# Patient Record
Sex: Male | Born: 1965 | Race: White | Hispanic: No
Health system: Midwestern US, Community
[De-identification: ages and names within clinical notes are randomized; demographics above are authoritative.]

## PROBLEM LIST (undated history)

## (undated) DIAGNOSIS — I2699 Other pulmonary embolism without acute cor pulmonale: Secondary | ICD-10-CM

## (undated) DIAGNOSIS — I219 Acute myocardial infarction, unspecified: Secondary | ICD-10-CM

---

## 2015-03-20 DIAGNOSIS — I219 Acute myocardial infarction, unspecified: Secondary | ICD-10-CM

## 2015-03-20 DIAGNOSIS — I2699 Other pulmonary embolism without acute cor pulmonale: Secondary | ICD-10-CM

## 2015-03-20 HISTORY — DX: Other pulmonary embolism without acute cor pulmonale: I26.99

## 2015-03-20 HISTORY — DX: Acute myocardial infarction, unspecified: I21.9

## 2017-12-28 NOTE — Nursing Note (Signed)
Medication Administration Follow Up-Text       Medication Administration Follow Up Entered On:  12/28/2017 20:35 EDT    Performed On:  12/28/2017 20:35 EDT by Ernestina Patches, RN, Adam      Intervention Information:     morphine  Performed by Ernestina Patches, RN, Adam on 12/28/2017 20:18:00 EDT       morphine,4mg   IV Push,Wrist, Left       Med Response   ED Medication Response :   Symptoms improved   Julien Girt - 12/28/2017 20:35 EDT

## 2017-12-28 NOTE — Nursing Note (Signed)
Medication Administration Follow Up-Text       Medication Administration Follow Up Entered On:  12/28/2017 20:35 EDT    Performed On:  12/28/2017 20:35 EDT by Ernestina PatchesPrescott, RN, Adam      Intervention Information:     nitroglycerin  Performed by Ernestina PatchesPrescott, RN, Adam on 12/28/2017 20:07:00 EDT       nitroglycerin,0.4mg   SL,chest pain       Med Response   ED Medication Response :   No change in symptoms   Julien Girtrescott, RN, Adam - 12/28/2017 20:35 EDT

## 2017-12-28 NOTE — Nursing Note (Signed)
Adult Patient History Form-Text       Adult Patient History Entered On:  12/28/2017 23:24 EDT    Performed On:  12/28/2017 23:20 EDT by DiBiaso, RN, JosephT               General Info   Patient Identified :   Identification band, Verbal   Patient Identified :   Isaac Andrade   Information Given By :   Self   Accompanied By :   None   Pregnancy Status :   N/A   Has the patient received chemotherapy or biotherapy within the last 48 hours? :   No   In Clinical Trial With Signed Consent for Related Condition :   No signed consent for clinical trial   Is the patient currently (2-3 days) receiving radiation treatment? :   No   DiBiaso, RN, JosephT - 12/28/2017 23:20 EDT   Allergies   (As Of: 12/28/2017 23:24:48 EDT)   Allergies (Active)   No Known Medication Allergies  Estimated Onset Date:   Unspecified ; Created By:   Ernestina Patches, RN, Adam; Reaction Status:   Active ; Category:   Drug ; Substance:   No Known Medication Allergies ; Type:   Allergy ; Updated By:   Julien Girt; Reviewed Date:   12/28/2017 23:21 EDT        Medication History   Medication List   (As Of: 12/28/2017 23:24:48 EDT)   Normal Order    aspirin 81 mg Chew Tab  :   aspirin 81 mg Chew Tab ; Status:   Ordered ; Ordered As Mnemonic:   aspirin ; Simple Display Line:   81 mg, 1 tabs, Oral, Daily ; Ordering Provider:   RIEDER-MD,  JEFFREY; Catalog Code:   aspirin ; Order Dt/Tm:   12/28/2017 23:13:04 EDT          sertraline  :   sertraline ; Status:   Voided ; Ordered As Mnemonic:   Zoloft 25 mg oral tablet ; Simple Display Line:   1 tabs, Oral, Daily ; Ordering Provider:   RIEDER-MD,  JEFFREY; Catalog Code:   sertraline ; Order Dt/Tm:   12/28/2017 23:13:10 EDT          sertraline 50 mg Tab  :   sertraline 50 mg Tab ; Status:   Ordered ; Ordered As Mnemonic:   Zoloft ; Simple Display Line:   25 mg, 0.5 tabs, Oral, Daily ; Ordering Provider:   RIEDER-MD,  JEFFREY; Catalog Code:   sertraline ; Order Dt/Tm:   12/28/2017 23:15:20 EDT          traZODone 50 mg Tab  :    traZODone 50 mg Tab ; Status:   Ordered ; Ordered As Mnemonic:   traZODone ; Simple Display Line:   50 mg, 1 tabs, Oral, TID ; Ordering Provider:   RIEDER-MD,  JEFFREY; Catalog Code:   traZODone ; Order Dt/Tm:   12/28/2017 96:04:54 EDT          traZODone  :   traZODone ; Status:   Voided ; Ordered As Mnemonic:   traZODone 50 mg oral tablet ; Simple Display Line:   1 tabs, Oral, TID ; Ordering Provider:   RIEDER-MD,  JEFFREY; Catalog Code:   traZODone ; Order Dt/Tm:   12/28/2017 23:13:12 EDT          morphine 4 mg/mL preservative-free Sol 1 mL  :   morphine 4 mg/mL preservative-free Sol 1 mL ; Status:  Completed ; Ordered As Mnemonic:   morphine ; Simple Display Line:   4 mg, 1 mL, IV Push, Once ; Ordering Provider:   Lynelle Doctor L; Catalog Code:   morphine ; Order Dt/Tm:   12/28/2017 20:10:10 EDT          Sodium Chloride 0.9% intravenous solution Bolus  :   Sodium Chloride 0.9% intravenous solution Bolus ; Status:   Completed ; Ordered As Mnemonic:   Sodium Chloride 0.9% bolus ; Simple Display Line:   1,000 mL, 2000 mL/hr, IV Piggyback, Once ; Ordering Provider:   BAUERBAND-MD,  BRIAN L; Catalog Code:   Sodium Chloride 0.9% ; Order Dt/Tm:   12/28/2017 20:10:10 EDT          iopamidol 76% Inj Soln 100 mL  :   iopamidol 76% Inj Soln 100 mL ; Status:   Completed ; Ordered As Mnemonic:   Isovue-370 ; Simple Display Line:   100 mL, IV Contrast, On Call ; Ordering Provider:   Stasia Cavalier; Catalog Code:   iopamidol ; Order Dt/Tm:   12/28/2017 20:09:49 EDT          aspirin 81 mg Chew Tab  :   aspirin 81 mg Chew Tab ; Status:   Completed ; Ordered As Mnemonic:   aspirin ; Simple Display Line:   324 mg, 4 tabs, Chewed, Once ; Ordering Provider:   Lynelle Doctor L; Catalog Code:   aspirin ; Order Dt/Tm:   12/28/2017 19:57:56 EDT          nitroglycerin 0.4 mg SL Tab  :   nitroglycerin 0.4 mg SL Tab ; Status:   Ordered ; Ordered As Mnemonic:   Nitrostat ; Simple Display Line:   0.4 mg, 1 tabs, SL,  q12min, PRN: chest pain ; Ordering Provider:   Stasia Cavalier; Catalog Code:   nitroglycerin ; Order Dt/Tm:   12/28/2017 19:57:56 EDT ; Comment:   May give every 5 minutes x3 doses as needed for chest pain or angina symptoms.            Home Meds    acetaminophen-oxyCODONE  :   acetaminophen-oxyCODONE ; Status:   Documented ; Ordered As Mnemonic:   Percocet 10/325 ; Simple Display Line:   1 tabs, Oral, q6hr, PRN: mild pain (1-3), 0 Refill(s) ; Catalog Code:   acetaminophen-oxyCODONE ; Order Dt/Tm:   12/28/2017 22:26:19 EDT ; Comment:   MAX DAILY DOSE OF ACETAMINOPHEN = 4000 MG          aspirin  :   aspirin ; Status:   Documented ; Ordered As Mnemonic:   aspirin 81 mg oral tablet ; Simple Display Line:   81 mg, 1 tabs, Oral, Daily, 90 tabs, 0 Refill(s) ; Catalog Code:   aspirin ; Order Dt/Tm:   12/28/2017 22:26:52 EDT          OLANZapine  :   OLANZapine ; Status:   Documented ; Ordered As Mnemonic:   ZyPREXA ; Simple Display Line:   0 Refill(s) ; Catalog Code:   OLANZapine ; Order Dt/Tm:   12/28/2017 16:10:96 EDT          sertraline  :   sertraline ; Status:   Documented ; Ordered As Mnemonic:   Zoloft 25 mg oral tablet ; Simple Display Line:   mg, tabs, Oral, Daily, 0 Refill(s) ; Catalog Code:   sertraline ; Order Dt/Tm:   12/28/2017 22:26:36 EDT  traZODone  :   traZODone ; Status:   Documented ; Ordered As Mnemonic:   traZODone 50 mg oral tablet ; Simple Display Line:   mg, tabs, Oral, TID, 0 Refill(s) ; Catalog Code:   traZODone ; Order Dt/Tm:   12/28/2017 22:26:42 EDT            Problem History   (As Of: 12/28/2017 23:24:48 EDT)   Diagnoses(Active)    Acute chest pain  Date:   12/28/2017 ; Diagnosis Type:   Discharge ; Confirmation:   Confirmed ; Clinical Dx:   Acute chest pain ; Classification:   Medical ; Clinical Service:   Non-Specified ; Code:   ICD-10-CM ; Probability:   0 ; Diagnosis Code:   R07.9      Chest pain  Date:   12/28/2017 ; Diagnosis Type:   Reason For Visit ; Confirmation:    Complaint of ; Clinical Dx:   Chest pain ; Classification:   Medical ; Clinical Service:   Emergency medicine ; Code:   PNED ; Probability:   0 ; Diagnosis Code:   [id number]      Hyperglycemia  Date:   12/28/2017 ; Diagnosis Type:   Discharge ; Confirmation:   Confirmed ; Clinical Dx:   Hyperglycemia ; Classification:   Medical ; Clinical Service:   Non-Specified ; Code:   ICD-10-CM ; Probability:   0 ; Diagnosis Code:   R73.9        Procedure History        -    Procedure History   (As Of: 12/28/2017 23:24:48 EDT)     Immunizations   Influenza Vaccine Status :   Received prior to admission, during current flu season   Influenza Vaccine Status :   Less than 5 years   DiBiaso, RN, JosephT - 12/28/2017 23:20 EDT   ID Risk Screen Symptoms   Recent Travel History :   No recent travel   TB Symptom Screen :   No symptoms   C. diff Symptom/History ID :   Neither of the above   MRSA/VRE Screening :   None of these apply   CRE Screening :   Not applicable   DiBiaso, RN, JosephT - 12/28/2017 23:20 EDT   Bloodless Medicine   Will Patient Accept Blood Transfusion and/or Blood Products :   Yes   DiBiaso, RN, JosephT - 12/28/2017 23:20 EDT   Nutrition   MST Have You Recently Lost Weight Without Trying? :   No   MST Weight Loss Score :   0    MST Have You Been Eating Poorly? :   No   MST Appetite Score :   0    MST Score :   0    MST Interpretation :   Not at risk   DiBiaso, RN, JosephT - 12/28/2017 23:20 EDT   Functional   Sensory Deficits :   None   ADLs Prior to Admission :   Independent   DiBiaso, RN, JosephT - 12/28/2017 23:20 EDT   Social History   Social History   (As Of: 12/28/2017 23:24:48 EDT)   Tobacco:        Tobacco use: Never (less than 100 in lifetime).   (Last Updated: 12/28/2017 23:22:51 EDT by Grayling Congress, RN, JosephT)          Alcohol:        Denies   (Last Updated: 12/28/2017 23:22:42 EDT by Grayling Congress, RN, JosephT)  Spiritual   Faith/Denomination :   [employer]aptist   Do you have a  concern that you would like to address with a Chaplain? :   No   Do you have any religious/spiritual/cultural beliefs that could impact the way your care is provided? :   No   DiBiaso, RN, JosephT - 12/28/2017 23:20 EDT   Harm Screen   Injuries/Abuse/Neglect in Household :   Denies   Feels Unsafe at Home :   No   Recent Life Events :   None   Family Behavioral Health History :   None   Previous Mental Illness Diagnosis :   Bipolar disorder   Suicidal Behavior :   None   Self Harming Behavior :   None   Suicidal Ideation :   None   DiBiaso, RN, JosephT - 12/28/2017 23:20 EDT   Advance Directive   Advance Directive :   No   Patient Wishes to Receive Further Information on Advance Directives :   No   DiBiaso, RN, JosephT - 12/28/2017 23:20 EDT   Education   Written Language :   [place]   Primary Language :   Sherri Rad, RN, JosephT - 12/28/2017 23:20 EDT   Caregiver/Advocate Language   Patient :   Demonstration, Verbal explanation   DiBiaso, RN, JosephT - 12/28/2017 23:20 EDT   Barriers to Learning :   None evident   Teaching Method :   Demonstration, Explanation   Responsible Learner Present for Session :   Yes   DiBiaso, RN, JosephT - 12/28/2017 23:20 EDT   Preventative Measures Information   Unit/Room Orientation :   Verbalizes understanding   Environmental Safety :   Verbalizes understanding   Hand Washing :   Verbalizes understanding   Infection Prevention :   Verbalizes understanding   DVT Prophylaxis :   Verbalizes understanding   Isolation Precaution :   Verbalizes understanding   DiBiaso, RN, JosephT - 12/28/2017 23:20 EDT   DC Needs   Living Situation :   Home independently   Anticipated Discharge Needs :   Community/Social Services   DiBiaso, RN, JosephT - 12/28/2017 23:20 EDT   Valuables and Belongings   Valuables and Belongings   At Bedside :   Cell phone, Money, Purse/Wallet, Clothes   DiBiaso, RN, JosephT - 12/28/2017 23:20 EDT   Patient Search Completed :   NA   DiBiaso, RN, JosephT - 12/28/2017  23:20 EDT   Admission Complete   Admission Complete :   Yes   DiBiaso, RN, JosephT - 12/28/2017 23:20 EDT

## 2017-12-29 LAB — CBC
Hematocrit: 40 % (ref 38.0–52.0)
Hemoglobin: 13.5 g/dL (ref 13.0–17.3)
MCH: 31.8 pg (ref 27.0–34.5)
MCHC: 33.7 g/dL (ref 32.0–36.0)
MCV: 94.3 fL (ref 84.0–100.0)
MPV: 7.4 fL (ref 7.2–13.2)
Platelets: 337 10*3/uL (ref 140–440)
RBC: 4.24 x10e6/mcL (ref 4.00–5.60)
RDW: 15 % (ref 11.0–16.0)
WBC: 13.6 10*3/uL — ABNORMAL HIGH (ref 3.8–10.6)

## 2017-12-29 LAB — HEMOGLOBIN A1C
Est. Avg. Glucose, WB: 131
Est. Avg. Glucose-calculated: 143
Hemoglobin A1C: 6.2 % — ABNORMAL HIGH (ref 4.0–6.0)

## 2017-12-29 LAB — TROPONIN T
Troponin T: <0.01 ng/mL (ref 0.000–0.010)
Troponin T: <0.01 ng/mL (ref 0.000–0.010)
Troponin T: <0.01 ng/mL (ref 0.000–0.010)

## 2017-12-29 LAB — COMPREHENSIVE METABOLIC PANEL
ALT: 31 U/L (ref 0–41)
AST: 15 U/L (ref 0–40)
Albumin/Globulin Ratio: 1.71 mmol/L (ref 1.00–2.00)
Albumin: 3.6 g/dL (ref 3.5–5.2)
Alk Phosphatase: 66 U/L (ref 40–130)
Anion Gap: 10 mmol/L (ref 2–17)
BUN: 23 mg/dL — ABNORMAL HIGH (ref 6–20)
CO2: 27 mmol/L (ref 22–29)
Calcium: 8.1 mg/dL — ABNORMAL LOW (ref 8.6–10.0)
Chloride: 103 mmol/L (ref 98–107)
Creatinine: 0.6 mg/dL — ABNORMAL LOW (ref 0.7–1.3)
GFR African American: 135 mL/min/{1.73_m2} (ref >=90)
GFR Non-African American: 116 mL/min/{1.73_m2} (ref >=90)
Globulin: 2.1 g/dL (ref 1.9–4.4)
Glucose: 105 mg/dL — ABNORMAL HIGH (ref 70–99)
OSMOLALITY CALCULATED: 283 mOsm/kg (ref 270–287)
Potassium: 4.2 mmol/L (ref 3.5–5.3)
Sodium: 140 mmol/L (ref 135–145)
Total Bilirubin: 0.2 mg/dL (ref 0.00–1.20)
Total Protein: 5.7 g/dL — ABNORMAL LOW (ref 6.4–8.3)

## 2017-12-29 LAB — LIPID PANEL
Chol/HDL Ratio: 2 (ref 0.0–4.4)
Cholesterol: 100 mg/dL (ref 100–200)
HDL: 51 mg/dL — ABNORMAL LOW (ref 55–72)
LDL Cholesterol: 33 mg/dL (ref 0.0–100.0)
LDL/HDL Ratio: 1
Triglycerides: 80 mg/dL (ref 0–149)
VLDL: 16 mg/dL (ref 5.0–40.0)

## 2017-12-29 LAB — BASIC METABOLIC PANEL
Anion Gap: 12 mmol/L (ref 2–17)
BUN: 21 mg/dL — ABNORMAL HIGH (ref 6–20)
CO2: 24 mmol/L (ref 22–29)
Calcium: 8.4 mg/dL — ABNORMAL LOW (ref 8.6–10.0)
Chloride: 101 mmol/L (ref 98–107)
Creatinine: 0.6 mg/dL — ABNORMAL LOW (ref 0.7–1.3)
GFR African American: 135 mL/min/{1.73_m2} (ref >=90)
GFR Non-African American: 116 mL/min/{1.73_m2} (ref >=90)
Glucose: 200 mg/dL — ABNORMAL HIGH (ref 70–99)
OSMOLALITY CALCULATED: 282 mOsm/kg (ref 270–287)
Potassium: 4.2 mmol/L (ref 3.5–5.3)
Sodium: 137 mmol/L (ref 135–145)

## 2017-12-29 LAB — POCT TROPONIN
POC Troponin I: 0 ng/mL (ref 0.00–0.08)
POC Troponin I: 0 ng/mL (ref 0.00–0.08)

## 2017-12-29 LAB — MAGNESIUM: Magnesium: 2.5 mg/dL (ref 1.6–2.6)

## 2017-12-29 LAB — POCT GLUCOSE: POC Glucose: 89 mg/dL (ref 70.0–120.0)

## 2017-12-29 LAB — PROTIME-INR
INR: 1 — ABNORMAL LOW (ref 1.5–3.5)
Protime: 13.5 seconds (ref 11.6–14.5)

## 2017-12-29 LAB — PTT: PTT: 24.6 seconds (ref 23.3–34.5)

## 2017-12-29 NOTE — Case Communication (Signed)
CM Discharge Planning Assessment - Text       CM Discharge Planning Ongoing Assessment Entered On:  12/29/2017 15:10 EDT    Performed On:  12/29/2017 15:09 EDT by Adela Ports L               Discharge Needs I   Previously Documented Discharge Needs :   DISCHARGE PLAN/NEEDS:  Discharge To, Anticipated: Homeless Shelter, Psychiatric Unit, Rehabilitation Unit - Alcohol/Drug - Drucie Opitz - 12/29/17 15:04:00  EQUIPMENT/TREATMENT NEEDS:       Previously Documented Benefits Information :   No discharge data available.     Discharge To :   Homeless Shelter, Psychiatric Unit, Rehabilitation Unit - Alcohol/Drug   Home Caregiver Name/Relationship CM :   contact person given:  Royston Sinner -- brother:  [phone]   CM Progress Note :   12/29/17-- JH-- 52 yo male who states he took the train from [place] to [place] and plans to stay a couple of weeks?  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  he is placed on a sucide watch and has a sitter at all times with him.  he has been on thorazine and zyprexia , but stopped these 3 months ago.  psych consult done .  patient is requesting long term drug and alcohol rehab.  cardiac workup cont for chest pain. cm to follow.      Drucie Opitz - 12/29/2017 15:09 EDT   Benefits   Insurance Information :   Medicare   Drucie Opitz - 12/29/2017 15:09 EDT   Discharge Planning   Discharge Arrangements :       Patient Post-Acute Information    Patient Name: CHANCELER, PULLIN  MRN: [id number]  FIN: [id number]  Gender: Male  DOB: [birth year]  Age:  52 Years        *** No Post-Acute Placement(s) Listed ***          *** No Post-Acute Service(s) Listed Drucie Opitz - 12/29/2017 15:09 EDT   Discharge Planning Assessment Status   Discharge Planning Assessment Complete :   Yes   HOLLADAY,  JANELLE L - 12/29/2017 15:09 EDT

## 2017-12-29 NOTE — Nursing Note (Signed)
Medication Administration Follow Up-Text       Medication Administration Follow Up Entered On:  12/29/2017 9:49 EDT    Performed On:  12/29/2017 9:49 EDT by Electa SniffBARNETT, RN, Lorriane ShireLAUREN E      Intervention Information:     nitroglycerin  Performed by Electa SniffBARNETT, RN, Lorriane ShireLAUREN E on 12/29/2017 08:53:00 EDT       nitroglycerin,0.4mg   SL,chest pain       Med Response   ED Medication Response :   No change in symptoms   Numeric Rating Pain Scale :   7   Pasero Opioid Induced Sedation Scale :   1 = Awake and alert   Electa SniffBARNETT, RLorriane Shire, LAUREN E - 12/29/2017 9:49 EDT

## 2017-12-29 NOTE — Case Communication (Signed)
CM Discharge Planning Assessment - Text       CM Discharge Planning Assessment Entered On:  12/29/2017 15:02 EDT    Performed On:  12/29/2017 14:57 EDT by Adela PortsHOLLADAY,  JANELLE L               Home Environment   Living Environment :   No Living Environment Information Available     Affect/Behavior :   Appropriate   Lives With :   Alone   Lives In :   Other: homeless   Living Situation :   Home independently   Drucie OpitzHOLLADAY,  JANELLE L - 12/29/2017 14:57 EDT   Home Environment   ADLs Prior to Admission :   Independent   Sensory Deficits :   None   HOLLADAY,  JANELLE L - 12/29/2017 14:57 EDT   Discharge Needs I   Previously Documented Discharge Needs :   DISCHARGE PLAN/NEEDS:  EQUIPMENT/TREATMENT NEEDS:       Previously Documented Benefits Information :   No discharge data available.     Discharge To :   Homeless Shelter, Psychiatric Unit, Rehabilitation Unit - Alcohol/Drug   Home Caregiver Name/Relationship CM :   contact person given:  Royston Sinnerjerry jeans -- brother:  [phone]  [phone]   CM Progress Note :   12/29/17-- JH-- 52 yo male who states he took the train from [place] to [place] and plans to stay a couple of weeks?  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

## 2017-12-29 NOTE — Nursing Note (Signed)
Medication Administration Follow Up-Text       Medication Administration Follow Up Entered On:  12/29/2017 0:22 EDT    Performed On:  12/29/2017 0:22 EDT by DiBiaso, RN, JosephT      Intervention Information:     zolpidem  Performed by DiBiaso, RN, JosephT on 12/29/2017 00:06:00 EDT       zolpidem,5mg   Oral,sleep       Med Response   ED Medication Response :   No adverse reaction   Pasero Opioid Induced Sedation Scale :   1 = Awake and alert   DiBiaso, RN, JosephT - 12/29/2017 0:22 EDT

## 2017-12-29 NOTE — Nursing Note (Signed)
Medication Administration Follow Up-Text       Medication Administration Follow Up Entered On:  12/29/2017 0:22 EDT    Performed On:  12/29/2017 0:22 EDT by Grayling Congress, RN, JosephT      Intervention Information:     acetaminophen  Performed by DiBiaso, RN, JosephT on 12/29/2017 00:06:00 EDT       acetaminophen,650mg   Oral,mild pain (1-3)       Med Response   ED Medication Response :   No adverse reaction   Pasero Opioid Induced Sedation Scale :   1 = Awake and alert   DiBiaso, RN, JosephT - 12/29/2017 0:22 EDT

## 2017-12-29 NOTE — Case Communication (Signed)
CM Discharge Planning Assessment - Text       CM Discharge Planning Ongoing Assessment Entered On:  12/29/2017 15:07 EDT    Performed On:  12/29/2017 15:04 EDT by Adela Ports L               Discharge Needs I   Previously Documented Discharge Needs :   DISCHARGE PLAN/NEEDS:  Discharge To, Anticipated: Homeless Shelter, Psychiatric Unit, Rehabilitation Unit - Alcohol/Drug - Drucie Opitz - 12/29/17 14:57:00  EQUIPMENT/TREATMENT NEEDS:       Previously Documented Benefits Information :   No discharge data available.     Discharge To :   Homeless Shelter, Psychiatric Unit, Rehabilitation Unit - Alcohol/Drug   Home Caregiver Name/Relationship CM :   contact person given:  Royston Sinner -- brother:  [phone]   CM Progress Note :   12/29/17-- JH-- 52 yo male who states he took the train from [place] to [place] and plans to stay a couple of weeks?  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  he is placed on a sucide watch and has a sitter at all times with him.  he has been on thorazine and zyprexia , but stopped these 3 months ago.  psych consult done .  patient is requesting long term drug and alcohol rehab.       HOLLADAY,  JANELLE L - 12/29/2017 15:04 EDT

## 2017-12-29 NOTE — Nursing Note (Signed)
Medication Administration Follow Up-Text       Medication Administration Follow Up Entered On:  12/29/2017 9:49 EDT    Performed On:  12/29/2017 9:49 EDT by Electa SniffBARNETT, RN, Lorriane ShireLAUREN E      Intervention Information:     nitroglycerin  Performed by Electa SniffBARNETT, RN, Lorriane ShireLAUREN E on 12/29/2017 09:00:00 EDT       nitroglycerin,0.4mg   SL,chest pain       Med Response   ED Medication Response :   No change in symptoms   Numeric Rating Pain Scale :   7   Pasero Opioid Induced Sedation Scale :   1 = Awake and alert   Electa SniffBARNETT, RLorriane Shire, LAUREN E - 12/29/2017 9:49 EDT

## 2017-12-30 LAB — URINE DRUG SCREEN
Amphetamine Screen, Urine: NEGATIVE
Barbiturate Screen, Ur: NEGATIVE
Benzodiazepine Screen, Urine: POSITIVE — AB
Cannabinoid Scrn, Ur: NEGATIVE
Cocaine Screen Urine: NEGATIVE
Opiate Screen, Urine: NEGATIVE
PCP Screen, Urine: NEGATIVE

## 2017-12-30 NOTE — Progress Notes (Signed)
Chaplaincy Note - Text       Chaplaincy Note Entered On:  12/30/2017 9:51 EDT    Performed On:  12/30/2017 9:51 EDT by Meriam SpragueJones,  Brittany               Chaplaincy Consult   [facility]   Importance of Faith :   Not assessed   Additional Information, Chaplaincy :   Attempted visit. P/t sleeping. Will follow-up per IDT request.    Meriam SpragueJones,  Brittany - 12/30/2017 9:51 EDT

## 2017-12-30 NOTE — Progress Notes (Signed)
Chaplaincy Note - Text       Chaplaincy Note Entered On:  12/30/2017 14:43 EDT    Performed On:  12/30/2017 14:41 EDT by Mellody Dance               Chaplaincy Consult   Faith/Denomination :   [facility]   Importance of Faith :   Moderately important   Religious Spiritual Resources :   Support services   Religious Spiritual Practices :   Prayer   Religious Spiritual Concerns :   Anxiety, Loneliness   Additional Information, Chaplaincy :   Chaplain visited with patient. He was not wanting to talk. He said hefinds comfort in going to church and praying, He desired prayer so I prayed for healing and peace. PC to f/u until d/c.   Mellody Dance - 12/30/2017 14:41 EDT

## 2017-12-30 NOTE — Nursing Note (Signed)
Medication Administration Follow Up-Text       Medication Administration Follow Up Entered On:  12/30/2017 16:55 EDT    Performed On:  12/30/2017 16:54 EDT by Coralee RudUDLEY, RN, NICOLE      Intervention Information:     acetaminophen  Performed by Coralee RudUDLEY, RN, NICOLE on 12/30/2017 15:54:00 EDT       acetaminophen,650mg   Oral,mild pain (1-3)       Med Response   ED Medication Response :   Symptoms improved   Numeric Rating Pain Scale :   0 = No pain   FACES Pain Scale :   0 = No hurt   Pasero Opioid Induced Sedation Scale :   1 = Awake and alert   Respiratory Rate :   20 br/min   DUDLEY, RN, NICOLE - 12/30/2017 16:54 EDT

## 2017-12-30 NOTE — Case Communication (Signed)
CM Discharge Planning Assessment - Text       CM Discharge Planning Ongoing Assessment Entered On:  12/30/2017 18:06 EDT    Performed On:  12/30/2017 18:05 EDT by [name]               Discharge Needs I   Previously Documented Discharge Needs :   DISCHARGE PLAN/NEEDS:  Discharge To, Anticipated: Homeless Shelter, Psychiatric Unit, Rehabilitation Unit - Alcohol/Drug - [name] - 12/29/17 15:09:00  EQUIPMENT/TREATMENT NEEDS:         Previously Documented Benefits Information :   Performed By: [name]  - 12/29/17 15:09:00       [name] - 12/30/2017 18:08 EDT     Discharge To :   Homeless Shelter, Psychiatric Unit, Rehabilitation Unit - Alcohol/Drug   Home Caregiver Name/Relationship CM :   contact person given:  Griffin Dakin -- brother:  [phone]   [name] - 12/30/2017 18:05 EDT   CM Progress Note :   12/29/17-- JH-- 52 yo male who states he took the train from [place] to [place] and plans to stay a couple of weeks?  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  he is placed on a sucide watch and has a sitter at all times with him.  he has been on thorazine and zyprexia , but stopped these 3 months ago.  psych consult done .  patient is requesting long term drug and alcohol rehab.  cardiac workup cont for chest pain. cm to follow.     12/30/17 MS: CM met with Dr. Constance Holster regarding dispo plan and arrangements being made for pt to return to [facility]. CM attempted to contact pt brother [name], to no avail, vm left. CM following. Proactive referral made to [facility].      [name] - 12/30/2017 18:08 EDT     Discharge Needs II   Discharge Planning Time Spent :   10 minutes   [name] - 12/30/2017 18:05 EDT

## 2017-12-31 NOTE — Case Communication (Signed)
CM Discharge Planning Assessment - Text       CM Discharge Planning Ongoing Assessment Entered On:  12/31/2017 12:09 EDT    Performed On:  12/31/2017 12:08 EDT by Isaac Andrade               Discharge Needs I   Previously Documented Discharge Needs :   DISCHARGE PLAN/NEEDS:  Discharge To, Anticipated: Homeless Shelter, Psychiatric Unit, Rehabilitation Unit - Alcohol/Drug - Isaac Andrade - 12/30/17 18:05:00  EQUIPMENT/TREATMENT NEEDS:       Previously Documented Benefits Information :   Performed By: Isaac Andrade  - 12/29/17 15:09:00       Isaac Andrade,  Isaac Andrade - 12/31/2017 12:33 EDT   Anticipated Discharge Date :   12/31/2017 EDT   Anticipated Discharge Time Slot :   1200-1400   Discharge To :   Homeless Shelter, Psychiatric Unit, Rehabilitation Unit - Alcohol/Drug   Home Caregiver Name/Relationship CM :   Isaac Andrade (brother):  [phone]   [name] - 12/31/2017 12:08 EDT   CM Progress Note :   12/29/17-- JH-- 52 yo male who states he took the train from [place] to [place] and plans to stay a couple of weeks?  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  he is placed on a sucide watch and has a sitter at all times with him.  he has been on thorazine and zyprexia , but stopped these 3 months ago.  psych consult done .  patient is requesting long term drug and alcohol rehab.  cardiac workup cont for chest pain. cm to follow.     12/30/17 MS: CM met with Dr. Constance Holster regarding dispo plan and arrangements being made for pt to return to [facility]. CM attempted to contact pt brother Isaac Andrade, to no avail, vm left. CM following. Proactive referral made to [facility].     12/31/17  CRS:  CM assisting with discharge plan.  Pt is medically stable for discharge.  He has been evaluated by Psychiatry.  He is NOT committed.  Pt is requesting long-term substance abuse treatment.  Pt has Medicare, which would not cover  long-term substance abuse treatment.  CM will send out voluntary placement referrals to see if any mental health/substance abuse facilities would be willing to accept this patient voluntarily for short-term treatment.  Pt is from [place]. so this may be a barrier for facilities to accept him.  CM has sent referrals to [facility], [place], [place], [place], [place], [facility], [place], [place], [place], and Paediatric nurse.  Will await their responses.  If no facility is able to accept this patient, then he will need to be discharged with resources and information for [facility].     [name] - 12/31/2017 12:33 EDT   Discharge Needs II   Discharge Planning Time Spent :   30 minutes   Isaac Andrade - 12/31/2017 12:33 EDT   DischargDischarge Device/Equipment CMe Device/Equipment CM :   None   Professional Skilled Services :   No Needs   Education administrator and Community Resources :   Discharge transportation, Substance Abuse Treatment - Isaac Andrade with Transportation :   Yes   Needs Assistance at Home Upon Discharge :   No   Requires Caregiver Involvement :   No   Isaac Andrade,  Isaac Andrade - 12/31/2017 12:08 EDT  Benefits   Admission Medicare Message Provided :   12/28/2017 23:00 EDT   [name] - 12/31/2017 12:33 EDT   Insurance Information :   Medicare   [name] - 12/31/2017 12:08 EDT   Discharge Planning   Discharge Arrangements :       Patient Post-Acute Information    Patient Name: Isaac Andrade, Isaac Andrade  MRN: [id number]  FIN: [id number]  Gender: Male  DOB: [birth year]  Age:  52 Years        *** No Post-Acute Placement(s) Listed ***          *** No Post-Acute Service(s) Listed ***       Barriers to Discharge Identified :   None identified   [name] - 12/31/2017 12:29 EDT   Discharge Planning Assessment Status   Discharge Planning Assessment Complete :   Yes   Isaac Andrade,  Isaac Andrade - 12/31/2017 12:29 EDT

## 2017-12-31 NOTE — Case Communication (Signed)
CM Discharge Planning Assessment - Text       CM Discharge Planning Ongoing Assessment Entered On:  12/31/2017 15:31 EDT    Performed On:  12/31/2017 15:27 EDT by Gwenlyn Found,  [name]               Discharge Needs I   Previously Documented Discharge Needs :   DISCHARGE PLAN/NEEDS:  Anticipated Discharge Date: 12/31/2017 Gwenlyn Found,  [name] - 12/31/17 14:18:00  Discharge To, Anticipated: Homeless Shelter, Psychiatric Unit, Rehabilitation Unit - [place],  [name] - 12/31/17 14:18:00  Needs Assistance with Transportation: Yes Gwenlyn Found,  [name] - 12/31/17 14:18:00  EQUIPMENT/TREATMENT NEEDS:  Needs Assistance at Home Upon Discharge: No Gwenlyn Found,  [name] - 12/31/17 14:18:00  Professional Skilled Services: No Needs - Gwenlyn Found,  [name] - 12/31/17 14:18:00  Special Services and Community Resources: Discharge transportation, Substance Abuse Treatment - [place],  [place] E - 12/31/17 14:18:00       Previously Documented Benefits Information :   Performed By: Nicholes Stairs E  - 12/31/17 14:18:00    Performed By: [name]  - 12/31/17 12:08:00    Performed By: Lona Kettle L  - 12/29/17 15:09:00       Anticipated Discharge Date :   12/31/2017 EDT   Anticipated Discharge Time Slot :   1200-1400   Discharge To :   Homeless Shelter, Psychiatric Unit, Rehabilitation Unit - Alcohol/Drug   Home Caregiver Name/Relationship CM :   Griffin Dakin (brother):  [phone]   CM Progress Note :   12/29/17-- JH-- 52 yo male who states he took the train from [place] to [place] and plans to stay a couple of weeks?  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  he is placed on a sucide watch and has a sitter at all times with him.  he has been on thorazine and zyprexia , but stopped these 3 months ago.  psych consult done .  patient is requesting long term drug and alcohol rehab.  cardiac workup cont for chest pain. cm to follow.      12/30/17 MS: CM met with Dr. Constance Holster regarding dispo plan and arrangements being made for pt to return to [facility]. CM attempted to contact pt brother [name], to no avail, vm left. CM following. Proactive referral made to [facility].     12/31/17  CRS:  CM assisting with discharge plan.  Pt is medically stable for discharge.  He has been evaluated by Psychiatry.  He is NOT committed.  Pt is requesting long-term substance abuse treatment.  Pt has Medicare, which would not cover long-term substance abuse treatment.  CM will send out voluntary placement referrals to see if any mental health/substance abuse facilities would be willing to accept this patient voluntarily for short-term treatment.  Pt is from [place]. so this may be a barrier for facilities to accept him.  CM has sent referrals to [facility], [place], [place], [place], [place], [facility], [place], [place], [place], and Paediatric nurse.  Will await their responses.  If no facility is able to accept this patient, then he will need to be discharged with resources and information for [facility].    12/31/17- AEB- seen in follow up with the voluntary placement referrals- declines from all referrals placed. Discussed poss self referrals to pt with local shelters, substance abuse treatment programs.  Pt reviewing resources provided. Referral information faxed to [facility]. Referral has also been placed to [facility]. Pt long term plan is return to [place]. Pt attempted to call his brother, LM. CM will follow.  **update at 15:30- spoke to [facility] at [facility]- pt medically cleared, able to take at [facility] when availablity opens-online application placed for placement to [facility]. Pt called local shelters, pt now states he does not have finances required at some shelters. Pt states he dod call [facility] also and told they are full. CM will continue to follow.     BERRY,  [name] - 12/31/2017 15:27 EDT    Discharge Needs II   DischargDischarge Device/Equipment CMe Device/Equipment CM :   None   Professional Skilled Services :   No Needs   Special Services and Community Resources :   Discharge transportation, Substance Abuse Treatment - [place] with Transportation :   Yes   Needs Assistance at Home Upon Discharge :   No   Requires Caregiver Involvement :   No   Discharge Planning Time Spent :   15 minutes   BERRY,  [name] - 12/31/2017 15:27 EDT   Benefits   Admission Medicare Message Provided :   12/28/2017 23:00 EDT   Insurance Information :   Medicare   BERRY,  [name] - 12/31/2017 15:27 EDT   Discharge Planning Assessment Status   Discharge Planning Assessment Complete :   Yes   BERRY,  [name] - 12/31/2017 15:27 EDT

## 2017-12-31 NOTE — Discharge Summary (Signed)
 Inpatient Patient Summary               [facility]  [address]  [place], [place] [address]  [phone]  Patient Discharge Instructions    Name: Isaac Andrade, Isaac Andrade  Current Date: 12/31/2017 16:51:45  DOB: 12/01/1965 MRN: [id number] FIN: [id number]%[phone]  Patient Address: [address] [place] [address]  Patient Phone: [phone]  Primary Care Provider:  Name: PCP,  NONE  Phone:    Immunizations Provided:      Discharge Diagnosis: 1:Behavior safety risk; 2:Hallucinations; 3:Methamphetamine abuse; 4:Schizoaffective disorder; 5:Encounter for evaluation of ability to make decisions regarding care; CAD (coronary artery disease); Hyperglycemia  Discharged To: TO, ANTICIPATED%>Psychiatric Unit  Home Treatments: TREATMENTS, ANTICIPATED%>  Devices/Equipment: EQUIPMENT REHAB%>  Post Hospital Services: HOSPITAL SERVICES%>[facility]:  [phone]      Professional Skilled Services: SKILLED SERVICES%>  Special Services and Community Resources: SERV AND COMM RES, ANTICIPATED%>  Mode of Discharge Transportation: TRANSPORTATION%>  Discharge Orders          Discharge Patient 12/31/17 16:07:00 EDT, Behavior safety risk  Hallucinations  Methamphetamine abuse  Schizoaffective disorder  Encounter for evaluation of ability to make decisions regarding care        Comment:     Medications   During the course of your visit, your medication list was updated with the most current information. The details of those changes are reflected below:          Medications That Were Updated - Follow Current Instructions  Printed Prescriptions  Current: OLANZapine (ZyPREXA 5 mg oral tablet) 1 Tabs Oral (given by mouth) Once a Day (at bedtime). Refills: 0.  Last Dose:____________________    Current: sertraline (Zoloft 50 mg oral tablet) 0.5 Tabs Oral (given by mouth) every day. Refills: 0.  Last Dose:____________________    Other Medications  Current: traZODone (traZODone 50 mg oral tablet) 1 Tabs Oral (given by mouth) 3  times a day.  Last Dose:____________________    Medications That Have Not Changed  Other Medications  acetaminophen-oxyCODONE (Percocet 10/325) 1 Tabs Oral (given by mouth) every 6 hours as needed mild pain (1-3)., MAX DAILY DOSE OF ACETAMINOPHEN = 4000 MG  Last Dose:____________________  aspirin (aspirin 81 mg oral tablet) 1 Tabs Oral (given by mouth) every day.  Last Dose:____________________        [facility] would like to thank you for allowing us  to assist you with your healthcare needs. The following includes patient education materials and information regarding your injury/illness.    Furniss, JEFFEY has been given the following list of follow-up instructions, prescriptions, and patient education materials:  Follow-up Instructions:            It is important to always keep an active list of medications available so that you can share with other providers and manage your medications appropriately. As an additional courtesy, we are also providing you with your final active medications list that you can keep with you.           acetaminophen-oxyCODONE (Percocet 10/325) 1 Tabs Oral (given by mouth) every 6 hours as needed mild pain (1-3)., MAX DAILY DOSE OF ACETAMINOPHEN = 4000 MG  aspirin (aspirin 81 mg oral tablet) 1 Tabs Oral (given by mouth) every day.  OLANZapine (ZyPREXA 5 mg oral tablet) 1 Tabs Oral (given by mouth) Once a Day (at bedtime). Refills: 0.  sertraline (Zoloft 50 mg oral tablet) 0.5 Tabs Oral (given by mouth) every day. Refills: 0.  traZODone (traZODone 50  mg oral tablet) 1 Tabs Oral (given by mouth) 3 times a day.      Take only the medications listed above. Contact your doctor prior to taking any medications not on this list.      Discharge instructions, if any, will display below    Instructions for Diet: INSTRUCTIONS FOR DIET%>   Instructions for Supplements: SUPPLEMENT INSTRUCTIONS%>   Instructions for Activity: INSTRUCTIONS FOR ACTIVITY%>   Instructions for Wound Care: INSTRUCTIONS FOR  WOUND CARE%>    Medication leaflets, if any, will display below     Patient education materials, if any, will display below          IS IT A STROKE? Act FAST and Check for these signs:    FACE                         Does the face look uneven?    ARM                         Does one arm drift down?    SPEECH                    Does their speech sound strange?    TIME                         Call 9-1-1 at any sign of stroke  Heart Attack Signs  Chest discomfort: Most heart attacks involve discomfort in the center of the chest and lasts more than a few minutes, or goes away and comes back. It can feel like uncomfortable pressure, squeezing, fullness or pain.  Discomfort in upper body: Symptoms can include pain or discomfort in one or both arms, back, neck, jaw or stomach.  Shortness of breath: With or without discomfort.  Other signs: Breaking out in a cold sweat, nausea, or lightheaded.  Remember, MINUTES DO MATTER. If you experience any of these heart attack warning signs, call 9-1-1 to get immediate medical attention!     ---------------------------------------------------------------------------------------------------------------------  [facility] allows you to manage your health, view your test results, and retrieve your discharge documents from your hospital stay securely and conveniently from your computer.  To begin the enrollment process, visit [email or web address] Click on "Sign up now" under [facility].

## 2017-12-31 NOTE — Case Communication (Signed)
CM Discharge Planning Assessment - Text       CM Discharge Planning Ongoing Assessment Entered On:  12/31/2017 16:34 EDT    Performed On:  12/31/2017 16:29 EDT by [name]               Discharge Needs I   Previously Documented Discharge Needs :   DISCHARGE PLAN/NEEDS:  Anticipated Discharge Date: 12/31/2017 Gwenlyn Found,  [name] - 12/31/17 15:27:00  Discharge To, Anticipated: Homeless Shelter, Psychiatric Unit, Rehabilitation Unit - [place],  [name] - 12/31/17 15:27:00  Needs Assistance with Transportation: Yes - Gwenlyn Found,  [name] - 12/31/17 15:27:00  EQUIPMENT/TREATMENT NEEDS:  Needs Assistance at Home Upon Discharge: No Gwenlyn Found,  [name] - 12/31/17 15:27:00  Professional Skilled Services: No Needs - BERRY,  [name] - 12/31/17 15:27:00  Special Services and Community Resources: Discharge transportation, Substance Abuse Treatment - [place],  [name] - 12/31/17 15:27:00       Previously Documented Benefits Information :   Performed By: Gwenlyn Found,  [name]  - 12/31/17 15:27:00    Performed By: Gwenlyn Found,  [name]  - 12/31/17 14:18:00    Performed By: [name]  - 12/31/17 12:08:00    Performed By: Lona Kettle L  - 12/29/17 15:09:00       Anticipated Discharge Date :   12/31/2017 EDT   Anticipated Discharge Time Slot :   1600-1800   Discharge To :   Psychiatric Unit   Home Caregiver Name/Relationship CM :   Griffin Dakin (brother):  [phone]   CM Progress Note :   12/29/17-- JH-- 52 yo male who states he took the train from [place] to [place] and plans to stay a couple of weeks?  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  he is placed on a sucide watch and has a sitter at all times with him.  he has been on thorazine and zyprexia , but stopped these 3 months ago.  psych consult done .  patient is requesting long term drug and alcohol rehab.  cardiac workup cont for chest pain. cm to follow.      12/30/17 MS: CM met with Dr. Constance Holster regarding dispo plan and arrangements being made for pt to return to [facility]. CM attempted to contact pt brother [name], to no avail, vm left. CM following. Proactive referral made to [facility].     12/31/17  CRS:  CM assisting with discharge plan.  Pt is medically stable for discharge.  He has been evaluated by Psychiatry.  He is NOT committed.  Pt is requesting long-term substance abuse treatment.  Pt has Medicare, which would not cover long-term substance abuse treatment.  CM will send out voluntary placement referrals to see if any mental health/substance abuse facilities would be willing to accept this patient voluntarily for short-term treatment.  Pt is from [place]. so this may be a barrier for facilities to accept him.  CM has sent referrals to [facility], [place], [place], [place], [place], [facility], [place], [place], [place], and Paediatric nurse.  Will await their responses.  If no facility is able to accept this patient, then he will need to be discharged with resources and information for [facility].    12/31/17- AEB- seen in follow up with the voluntary placement referrals- declines from all referrals placed. Discussed poss self referrals to pt  with local shelters, substance abuse treatment programs. Pt reviewing resources provided. Referral information faxed to [facility]. Referral has also been placed to [facility]. Pt long term plan is return to [place]. Pt attempted to call his brother, LM. CM will follow.  **update at 15:30- spoke to [facility] at [facility]- pt medically cleared, able to take at [facility] when availablity opens-online application placed for placement to [facility]. Pt called local shelters, pt now states he does not have finances required at some shelters. Pt states he dod call [facility] also and told they are full. CM will continue to follow.    12/31/17  CRS:  CM spoke with RN Babs Sciara at  [facility]).  Pt has been accepted there by Dr. Adonis Brook.  RN will need to call report to [phone].  CM has made follow-up visit with with the pt to talk with him about expectations at [facility] (this is only for short-term psychiatric stabilization).  CM advised pt that he will need to continue working on establishing a long-term plan because [facility]'s average length of stay is 3-4 days and they cannot secure housing/shelter for him upon discharge, but they will assist him with community resources.  Pt verbalized understanding.  Pt signed EMTALA and udpated IM letter.  CM has called [facility] and arranged 18:00 transport time (forms faxed to [facility]).  SBAR given to [employer].     [name] - 12/31/2017 16:29 EDT   Discharge Needs II   DischargDischarge Device/Equipment CMe Device/Equipment CM :   None   Professional Skilled Services :   No Needs   Special Services and Community Resources :   Counseling, Discharge transportation, Lexicographer, Substance [place] with Transportation :   Yes   Discharge Transportation Arranged :   Yes   (Comment: [facility] 18:00 [SPRINGER,  COURTNEY R - 12/31/2017 16:29 EDT] )   Needs Assistance at Home Upon Discharge :   No   Requires Caregiver Involvement :   No   Discharge Planning Time Spent :   120 minutes   [name] - 12/31/2017 16:29 EDT   Benefits   Admission Medicare Message Provided :   12/28/2017 23:00 EDT   Insurance Information :   Medicare   [name] 12/31/2017 16:29 EDT   Discharge Planning   Discharge Arrangements :       Patient Post-Acute Information    Patient Name: KENDEN, BRANDT  MRN: [id number]  FIN: [id number]  Gender: Male  DOB: 25-Jan-2066  Age:  52 Years        [facility] Post-Acute Placement(s):    Business Name: Business Address: Phone Number:      [facility] [address], [place], [place], [address] [phone]             *** No Post-Acute Service(s) Listed [facility] Services :   [place]:  [phone]     Patient Choices Made :   Pt is agreeable to voluntary inpatient psychiatric treatment.     Discharge Options Discussed with Patient :   Outpatient, Other: Inpatient psychiatric treatment   Interventions Performed :   All resolved   Discharge Plan Discussion :   Discussed with patient, Patient agrees with plan   Discharge Medicare Message Date/Time :   12/31/2017 16:00  EDT   Barriers to Discharge Identified :   None identified   [name] - 12/31/2017 16:29 EDT   Discharge Planning Assessment Status   Discharge Planning Assessment Complete :   Yes   [name] - 12/31/2017 16:29 EDT

## 2017-12-31 NOTE — Discharge Summary (Signed)
 Inpatient Clinical Summary             [facility]  Post-Acute Care Transfer Instructions  PERSON INFORMATION   Name: Isaac Andrade, Isaac Andrade   MRN: [id number]    FIN#: [id number]   PHYSICIANS  Admitting Physician: RIEDER-MD,  JEFFREY  Attending Physician: RIEDER-MD,  JEFFREY   PCP: PCP,  NONE  Discharge Diagnosis: 1:Behavior safety risk; 2:Hallucinations; 3:Methamphetamine abuse; 4:Schizoaffective disorder; 5:Encounter for evaluation of ability to make decisions regarding care; CAD (coronary artery disease); Hyperglycemia  Comment:       PATIENT EDUCATION INFORMATION  Instructions:               Medication Leaflets:               Follow-up:                                   MEDICATION LIST  Medication Reconciliation at Discharge:          Medications That Were Updated - Follow Current Instructions  Printed Prescriptions  Current: OLANZapine (ZyPREXA 5 mg oral tablet) 1 Tabs Oral (given by mouth) Once a Day (at bedtime). Refills: 0.  Last Dose:____________________    Current: sertraline (Zoloft 50 mg oral tablet) 0.5 Tabs Oral (given by mouth) every day. Refills: 0.  Last Dose:____________________    Other Medications  Current: traZODone (traZODone 50 mg oral tablet) 1 Tabs Oral (given by mouth) 3 times a day.  Last Dose:____________________    Medications That Have Not Changed  Other Medications  acetaminophen-oxyCODONE (Percocet 10/325) 1 Tabs Oral (given by mouth) every 6 hours as needed mild pain (1-3)., MAX DAILY DOSE OF ACETAMINOPHEN = 4000 MG  Last Dose:____________________  aspirin (aspirin 81 mg oral tablet) 1 Tabs Oral (given by mouth) every day.  Last Dose:____________________         Patient's Final Home Medication List Upon Discharge:           acetaminophen-oxyCODONE (Percocet 10/325) 1 Tabs Oral (given by mouth) every 6 hours as needed mild pain (1-3)., MAX DAILY DOSE OF ACETAMINOPHEN = 4000 MG  aspirin (aspirin 81 mg oral tablet) 1 Tabs Oral (given by mouth) every day.  OLANZapine (ZyPREXA 5 mg oral tablet)  1 Tabs Oral (given by mouth) Once a Day (at bedtime). Refills: 0.  sertraline (Zoloft 50 mg oral tablet) 0.5 Tabs Oral (given by mouth) every day. Refills: 0.  traZODone (traZODone 50 mg oral tablet) 1 Tabs Oral (given by mouth) 3 times a day.         Comment:       ORDERS          Order Name Order Details   Discharge Patient 12/31/17 16:07:00 EDT, Behavior safety risk  Hallucinations  Methamphetamine abuse  Schizoaffective disorder  Encounter for evaluation of ability to make decisions regarding care

## 2017-12-31 NOTE — Nursing Note (Signed)
Medication Administration Follow Up-Text       Medication Administration Follow Up Entered On:  12/31/2017 0:24 EDT    Performed On:  12/30/2017 22:30 EDT by Tiburcio PeaNilsen, RN, Sophia      Intervention Information:     acetaminophen  Performed by Tiburcio PeaNilsen, RN, Sophia on 12/30/2017 21:04:00 EDT       acetaminophen,650mg   Oral,mild pain (1-3)       Med Response   ED Medication Response :   No adverse reaction, Symptoms improved   Numeric Rating Pain Scale :   0 = No pain   [place] Opioid Induced Sedation Scale :   S = Sleep, easy to arouse   Karl LukeNilsen, RN, Sophia - 12/31/2017 0:24 EDT

## 2017-12-31 NOTE — Case Communication (Signed)
CM Discharge Planning Assessment - Text       CM Discharge Planning Ongoing Assessment Entered On:  12/31/2017 14:26 EDT    Performed On:  12/31/2017 14:18 EDT by Gwenlyn Found,  [name]               Discharge Needs I   Previously Documented Discharge Needs :   DISCHARGE PLAN/NEEDS:  Anticipated Discharge Date: 12/31/2017 - [facility] - 12/31/17 12:08:00  Discharge To, Anticipated: Homeless Shelter, Psychiatric Unit, [place],  [place] - 12/31/17 12:08:00  Needs Assistance with Transportation: Yes [facility] R - 12/31/17 12:08:00  EQUIPMENT/TREATMENT NEEDS:  Needs Assistance at Home Upon Discharge: No [facility] - 12/31/17 12:08:00  Professional Skilled Services: No Needs - [facility] - 12/31/17 12:08:00  Special Services and Community Resources: Discharge transportation, Substance Abuse Treatment - [facility],  [facility] - 12/31/17 12:08:00       Previously Documented Benefits Information :   Performed By: Isaac Andrade R  - 12/31/17 12:08:00    Performed By: [name]  - 12/29/17 15:09:00       Anticipated Discharge Date :   12/31/2017 EDT   Anticipated Discharge Time Slot :   1200-1400   Discharge To :   Homeless Shelter, Psychiatric Unit, Rehabilitation Unit - Alcohol/Drug   Home Caregiver Name/Relationship CM :   Griffin Dakin (brother):  [phone]   CM Progress Note :   12/29/17-- JH-- 52 yo male who states he took the train from [facility] to [place] and plans to stay a couple of weeks?  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  he is placed on a sucide watch and has a sitter at all times with him.  he has been on thorazine and zyprexia , but stopped these 3 months ago.  psych consult done .  patient is requesting long term drug and alcohol rehab.  cardiac workup cont for chest pain. cm to follow.     12/30/17  MS: CM met with Dr. Constance Holster regarding dispo plan and arrangements being made for pt to return to [facility]. CM attempted to contact pt brother Isaac Andrade, to no avail, vm left. CM following. Proactive referral made to [facility].     12/31/17  CRS:  CM assisting with discharge plan.  Pt is medically stable for discharge.  He has been evaluated by Psychiatry.  He is NOT committed.  Pt is requesting long-term substance abuse treatment.  Pt has Medicare, which would not cover long-term substance abuse treatment.  CM will send out voluntary placement referrals to see if any mental health/substance abuse facilities would be willing to accept this patient voluntarily for short-term treatment.  Pt is from [place]. so this may be a barrier for facilities to accept him.  CM has sent referrals to [facility], [place], [place], [place], [facility], [facility], [facility], [facility], [facility], and Paediatric nurse.  Will await their responses.  If no facility is able to accept this patient, then he will need to be discharged with resources and information for [facility].    12/31/17- AEB- seen in follow up with the voluntary placement referrals- declines from all referrals placed. Discussed poss self referrals to pt with local shelters, substance abuse treatment programs. Pt reviewing resources provided. Referral information faxed to [facility]. Referral has  also been placed to [facility]. Pt long term plan is return to [place]. Pt attempted to call his brother, LM. CM will follow.       BERRY,  [name] - 12/31/2017 14:18 EDT   Discharge Needs II   DischargDischarge Device/Equipment CMe Device/Equipment CM :   None   Professional Skilled Services :   No Needs   Special Services and Community Resources :   Discharge transportation, Substance Abuse Treatment - [place] with Transportation :   Yes   Needs Assistance at Home Upon Discharge :   No   Requires Caregiver Involvement :    No   Discharge Planning Time Spent :   30 minutes   BERRY,  [name] - 12/31/2017 14:18 EDT   Benefits   Admission Medicare Message Provided :   12/28/2017 23:00 EDT   Insurance Information :   [facility]ac Andrade,  [name] - 12/31/2017 14:18 EDT   Discharge Planning   Discharge Arrangements :       Patient Post-Acute Information    Patient Name: Isaac Andrade, Isaac Andrade  MRN: [id number]  FIN: [id number]  Gender: Male  DOB: Jun 14, 2065  Age:  52 Years        *** No Post-Acute Placement(s) Listed ***          *** No Post-Acute Service(s) Listed ***       Discharge Plan Discussion :   Discussed with patient   Barriers to Discharge Identified :   None identified   BERRY,  [name] - 12/31/2017 14:18 EDT   Discharge Planning Assessment Status   Discharge Planning Assessment Complete :   Yes   BERRY,  [name] - 12/31/2017 14:18 EDT

## 2018-01-04 ENCOUNTER — Emergency Department (HOSPITAL_COMMUNITY): Payer: Medicare Other

## 2018-01-04 ENCOUNTER — Encounter (HOSPITAL_COMMUNITY): Payer: Self-pay | Admitting: Emergency Medicine

## 2018-01-04 ENCOUNTER — Emergency Department (HOSPITAL_COMMUNITY)
Admission: EM | Admit: 2018-01-04 | Discharge: 2018-01-05 | Disposition: A | Discharge status: Home / Self Care | Payer: Medicare Other | Attending: Emergency Medicine | Admitting: Emergency Medicine

## 2018-01-04 ENCOUNTER — Other Ambulatory Visit: Payer: Self-pay

## 2018-01-04 DIAGNOSIS — R45851 Suicidal ideations: Secondary | ICD-10-CM | POA: Insufficient documentation

## 2018-01-04 DIAGNOSIS — T391X2A Poisoning by 4-Aminophenol derivatives, intentional self-harm, initial encounter: Secondary | ICD-10-CM | POA: Insufficient documentation

## 2018-01-04 DIAGNOSIS — F1520 Other stimulant dependence, uncomplicated: Secondary | ICD-10-CM | POA: Insufficient documentation

## 2018-01-04 DIAGNOSIS — F1020 Alcohol dependence, uncomplicated: Secondary | ICD-10-CM | POA: Diagnosis not present

## 2018-01-04 DIAGNOSIS — R079 Chest pain, unspecified: Secondary | ICD-10-CM | POA: Diagnosis not present

## 2018-01-04 DIAGNOSIS — F250 Schizoaffective disorder, bipolar type: Secondary | ICD-10-CM | POA: Diagnosis not present

## 2018-01-04 DIAGNOSIS — F1120 Opioid dependence, uncomplicated: Secondary | ICD-10-CM | POA: Diagnosis not present

## 2018-01-04 HISTORY — DX: Other pulmonary embolism without acute cor pulmonale: I26.99

## 2018-01-04 HISTORY — DX: Acute myocardial infarction, unspecified: I21.9

## 2018-01-04 LAB — HEPATIC FUNCTION PANEL
ALT: 16 U/L (ref 0–44)
AST: 38 U/L (ref 15–41)
Albumin: 3.4 g/dL — ABNORMAL LOW (ref 3.5–5.0)
Alkaline Phosphatase: 56 U/L (ref 38–126)
BILIRUBIN INDIRECT: 0.8 mg/dL (ref 0.3–0.9)
Bilirubin, Direct: 0.6 mg/dL — ABNORMAL HIGH (ref 0.0–0.2)
Total Bilirubin: 1.4 mg/dL — ABNORMAL HIGH (ref 0.3–1.2)
Total Protein: 6.1 g/dL — ABNORMAL LOW (ref 6.5–8.1)

## 2018-01-04 LAB — I-STAT TROPONIN, ED: TROPONIN I, POC: 0 ng/mL (ref 0.00–0.08)

## 2018-01-04 LAB — BASIC METABOLIC PANEL
Anion gap: 6 (ref 5–15)
BUN: 12 mg/dL (ref 6–20)
CO2: 25 mmol/L (ref 22–32)
CREATININE: 0.94 mg/dL (ref 0.61–1.24)
Calcium: 8.5 mg/dL — ABNORMAL LOW (ref 8.9–10.3)
Chloride: 106 mmol/L (ref 98–111)
Glucose, Bld: 119 mg/dL — ABNORMAL HIGH (ref 70–99)
Potassium: 3.9 mmol/L (ref 3.5–5.1)
SODIUM: 137 mmol/L (ref 135–145)

## 2018-01-04 LAB — SALICYLATE LEVEL

## 2018-01-04 LAB — CBC
HCT: 39.1 % (ref 39.0–52.0)
Hemoglobin: 12.8 g/dL — ABNORMAL LOW (ref 13.0–17.0)
MCH: 30.9 pg (ref 26.0–34.0)
MCHC: 32.7 g/dL (ref 30.0–36.0)
MCV: 94.4 fL (ref 80.0–100.0)
NRBC: 0 % (ref 0.0–0.2)
PLATELETS: 275 10*3/uL (ref 150–400)
RBC: 4.14 MIL/uL — ABNORMAL LOW (ref 4.22–5.81)
RDW: 14.3 % (ref 11.5–15.5)
WBC: 9.7 10*3/uL (ref 4.0–10.5)

## 2018-01-04 LAB — ACETAMINOPHEN LEVEL

## 2018-01-04 LAB — ETHANOL

## 2018-01-04 MED ORDER — FAMOTIDINE IN NACL 20-0.9 MG/50ML-% IV SOLN
20.0000 mg | Freq: Once | INTRAVENOUS | Status: AC
  Administered 2018-01-04: 20 mg via INTRAVENOUS
  Filled 2018-01-04: qty 50

## 2018-01-04 MED ORDER — OXYCODONE-ACETAMINOPHEN 5-325 MG PO TABS: 2.0000 | ORAL_TABLET | Freq: Once | ORAL | Status: DC

## 2018-01-04 MED ORDER — MORPHINE SULFATE (PF) 4 MG/ML IV SOLN
4.0000 mg | Freq: Once | INTRAVENOUS | Status: AC
  Administered 2018-01-04: 4 mg via INTRAVENOUS
  Filled 2018-01-04: qty 1

## 2018-01-04 MED ORDER — GI COCKTAIL ~~LOC~~
30.0000 mL | Freq: Once | ORAL | Status: AC
  Administered 2018-01-04: 30 mL via ORAL
  Filled 2018-01-04: qty 30

## 2018-01-04 MED ORDER — ONDANSETRON HCL 4 MG/2ML IJ SOLN
4.0000 mg | Freq: Once | INTRAMUSCULAR | Status: AC
  Administered 2018-01-04: 4 mg via INTRAVENOUS
  Filled 2018-01-04: qty 2

## 2018-01-04 NOTE — ED Notes (Signed)
Pt informed this RN that this evening he went to [employer] and took approximately 12 tylenol PM with the intent to harm himself, MD informed

## 2018-01-04 NOTE — ED Notes (Signed)
ED Provider at bedside. 

## 2018-01-04 NOTE — ED Provider Notes (Signed)
[facility] EMERGENCY DEPARTMENT Provider Note   CSN: [id number] Arrival date & time: 01/04/18  2111     History   Chief Complaint Chief Complaint  Patient presents with  . Chest Pain    HPI Eric Romero is a 52 y.o. male.  He is presenting to the emergency department with pain in his chest that radiates to his left arm and jaw that started around 2 PM today.  It occurred when he was walking back from [place] any caused him to be very sweaty.  He said he ate some spicy food later and he became nauseous and vomiting and had worsening of the pain starting around 8 PM tonight.  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  They did stop the Coumadin because he was having an allergic reaction to it.  He does not smoke.  He is visiting from [place].  He rates the pain is 8 out of 10.  The history is provided by the patient.  Chest Pain   This is a new problem. The current episode started 6 to 12 hours ago. The problem occurs constantly. The problem has been gradually worsening. The pain is associated with exertion and eating. The pain is present in the substernal region. The pain is at a severity of 8/10. The quality of the pain is described as pressure-like. The pain radiates to the left jaw, left neck, left shoulder and left arm. Associated symptoms include diaphoresis, nausea, shortness of breath and vomiting. Pertinent negatives include no abdominal pain, no back pain, no cough, no fever, no headaches, no leg pain and no syncope. He has tried rest for the symptoms. The treatment provided mild relief.  His past medical history is significant for CAD.    Past Medical History:  Diagnosis Date  . MI (myocardial infarction) (HCC) 2017  . PE (pulmonary thromboembolism) (HCC) 2017    There are no active problems to display for this patient.   History reviewed. No pertinent surgical  history.      Home Medications    Prior to Admission medications   Not on File    Family History No family history on file.  Social History Social History   Tobacco Use  . Smoking status: Never Smoker  . Smokeless tobacco: Never Used  Substance Use Topics  . Alcohol use: Not Currently    Frequency: Never  . Drug use: Never     Allergies   Coumadin [warfarin sodium]   Review of Systems Review of Systems  Constitutional: Positive for diaphoresis. Negative for fever.  HENT: Negative for sore throat.   Eyes: Negative for visual disturbance.  Respiratory: Positive for shortness of breath. Negative for cough.   Cardiovascular: Positive for chest pain. Negative for syncope.  Gastrointestinal: Positive for nausea and vomiting. Negative for abdominal pain.  Genitourinary: Negative for dysuria.  Musculoskeletal: Negative for back pain.  Skin: Negative for rash.  Neurological: Negative for headaches.     Physical Exam Updated Vital Signs BP (!) 133/96 (BP Location: Right Arm)   Pulse 89   Resp 19   Ht 5\' 11"  (1.803 m)   Wt 118.8 kg   SpO2 96%   BMI 36.54 kg/m   Physical Exam  Constitutional: He appears well-developed and well-nourished.  HENT:  Head: Normocephalic and atraumatic.  Eyes: Conjunctivae are normal.  Neck: Neck supple.  Cardiovascular: Normal rate, regular rhythm and normal pulses.  No murmur heard. Pulmonary/Chest: Effort normal and breath sounds normal. No respiratory distress.  Abdominal: Soft. There is no tenderness.  Musculoskeletal: Normal range of motion.       Right lower leg: He exhibits edema. He exhibits no tenderness.       Left lower leg: He exhibits edema. He exhibits no tenderness.  Neurological: He is alert.  Skin: Skin is warm and dry. Capillary refill takes less than 2 seconds.  Psychiatric: He has a normal mood and affect.  Nursing note and vitals reviewed.    ED Treatments / Results  Labs (all labs ordered are listed,  but only abnormal results are displayed) Labs Reviewed  BASIC METABOLIC PANEL - Abnormal; Notable for the following components:      Result Value   Glucose, Bld 119 (*)    Calcium 8.5 (*)    All other components within normal limits  CBC - Abnormal; Notable for the following components:   RBC 4.14 (*)    Hemoglobin 12.8 (*)    All other components within normal limits  HEPATIC FUNCTION PANEL - Abnormal; Notable for the following components:   Total Protein 6.1 (*)    Albumin 3.4 (*)    Total Bilirubin 1.4 (*)    Bilirubin, Direct 0.6 (*)    All other components within normal limits  RAPID URINE DRUG SCREEN, HOSP PERFORMED - Abnormal; Notable for the following components:   Opiates POSITIVE (*)    All other components within normal limits  ACETAMINOPHEN LEVEL - Abnormal; Notable for the following components:   Acetaminophen (Tylenol), Serum <10 (*)    All other components within normal limits  ETHANOL  SALICYLATE LEVEL  I-STAT TROPONIN, ED  I-STAT TROPONIN, ED    EKG EKG Interpretation  Date/Time:  Saturday January 04 2018 21:18:38 EDT Ventricular Rate:  89 PR Interval:    QRS Duration: 111 QT Interval:  368 QTC Calculation: 448 R Axis:   141 Text Interpretation:  Sinus rhythm Left posterior fascicular block Confirmed by Gilda Crease [id number]) on 01/05/2018 9:21:51 AM   Radiology Dg Chest 2 View  Result Date: 01/04/2018 CLINICAL DATA:  Left-sided chest pain. EXAM: CHEST - 2 VIEW COMPARISON:  None. FINDINGS: The cardiomediastinal contours are normal. The lungs are clear. Pulmonary vasculature is normal. No consolidation, pleural effusion, or pneumothorax. No acute osseous abnormalities are seen. Degenerative change in the spine. IMPRESSION: No acute chest finding. Electronically Signed   By: Narda Rutherford M.D.   On: 01/04/2018 22:06    Procedures Procedures (including critical care time)  Medications Ordered in ED Medications  morphine 4 MG/ML injection  4 mg (has no administration in time range)  gi cocktail (Maalox,Lidocaine,Donnatal) (has no administration in time range)  ondansetron (ZOFRAN) injection 4 mg (has no administration in time range)     Initial Impression / Assessment and Plan / ED Course  I have reviewed the triage vital signs and the nursing notes.  Pertinent labs & imaging results that were available during my care of the patient were reviewed by me and considered in my medical decision making (see chart for details).  Clinical Course as of Jan 05 1141  Sat Jan 04, 2018  2143 EKG normal sinus rhythm rate of 89 normal intervals no acute ST-T changes.  No prior to compare with.   [MB]  2241 Reevaluated patient.  He looks very comfortable but he says he still in a lot of pain.   [  MB]  2246 Patient just informed the nurse that he is suicidal.  I went to talk to home.  He said he went to [facility] and had but a bottle of Tylenol PM and taken 12 tablets of those.  It caused him to feel nauseous and he vomited.  He lost his wife about 6 months ago and is been increasingly depressed and had thoughts of suicide.  He feels if he is released from here he will try to hurt himself again.  Of added on more tests including a Tylenol level and will eventually need screening by TTS.   [MB]  2341 Patient's LFTs and his acetaminophen level are unremarkable.  From an overdose standpoint I do not think his ingestion will require any medical treatment.  He is pending a second troponin and then would be medically cleared for TTS consult.  I will sign this out to the oncoming attending.   [MB]    Clinical Course User Index [MB] Terrilee Files, MD      Final Clinical Impressions(s) / ED Diagnoses   Final diagnoses:  None    ED Discharge Orders    None       Terrilee Files, MD 01/05/18 1143

## 2018-01-04 NOTE — ED Triage Notes (Addendum)
Pt to ED from friend's home where he was visiting from [place] with central cp radiating to the left side, arm, and jaw starting around 2000 tonight with shob and diaphoresis. Hx of MI and PE 2 years ago. Pt previously on Coumadin for PE but recently discontinued by PCP due to adverse skin reaction. 324 ASA, 1 sln, 6mg  morphine given PTA with relief from 10/10 to 7/10.

## 2018-01-05 ENCOUNTER — Emergency Department (HOSPITAL_COMMUNITY): Payer: Medicare Other

## 2018-01-05 ENCOUNTER — Encounter (HOSPITAL_COMMUNITY): Payer: Self-pay | Admitting: *Deleted

## 2018-01-05 ENCOUNTER — Encounter (HOSPITAL_COMMUNITY): Payer: Self-pay | Admitting: Emergency Medicine

## 2018-01-05 ENCOUNTER — Other Ambulatory Visit: Payer: Self-pay

## 2018-01-05 ENCOUNTER — Emergency Department (HOSPITAL_COMMUNITY)
Admission: EM | Admit: 2018-01-05 | Discharge: 2018-01-05 | Disposition: A | Discharge status: Home / Self Care | Payer: Medicare Other | Source: Home / Self Care | Attending: Emergency Medicine | Admitting: Emergency Medicine

## 2018-01-05 DIAGNOSIS — I252 Old myocardial infarction: Secondary | ICD-10-CM | POA: Insufficient documentation

## 2018-01-05 DIAGNOSIS — Z79899 Other long term (current) drug therapy: Secondary | ICD-10-CM

## 2018-01-05 DIAGNOSIS — J40 Bronchitis, not specified as acute or chronic: Secondary | ICD-10-CM | POA: Insufficient documentation

## 2018-01-05 DIAGNOSIS — R0602 Shortness of breath: Secondary | ICD-10-CM | POA: Insufficient documentation

## 2018-01-05 LAB — I-STAT TROPONIN, ED
TROPONIN I, POC: 0 ng/mL (ref 0.00–0.08)
Troponin i, poc: 0 ng/mL (ref 0.00–0.08)
Troponin i, poc: 0 ng/mL (ref 0.00–0.08)

## 2018-01-05 LAB — BASIC METABOLIC PANEL
Anion gap: 9 (ref 5–15)
BUN: 14 mg/dL (ref 6–20)
CHLORIDE: 106 mmol/L (ref 98–111)
CO2: 25 mmol/L (ref 22–32)
Calcium: 8.5 mg/dL — ABNORMAL LOW (ref 8.9–10.3)
Creatinine, Ser: 0.76 mg/dL (ref 0.61–1.24)
GFR calc non Af Amer: >60 mL/min (ref >60)
Glucose, Bld: 120 mg/dL — ABNORMAL HIGH (ref 70–99)
POTASSIUM: 3.8 mmol/L (ref 3.5–5.1)
SODIUM: 140 mmol/L (ref 135–145)

## 2018-01-05 LAB — CBC
HEMATOCRIT: 39 % (ref 39.0–52.0)
HEMOGLOBIN: 12.4 g/dL — AB (ref 13.0–17.0)
MCH: 30.5 pg (ref 26.0–34.0)
MCHC: 31.8 g/dL (ref 30.0–36.0)
MCV: 96.1 fL (ref 80.0–100.0)
Platelets: 246 10*3/uL (ref 150–400)
RBC: 4.06 MIL/uL — ABNORMAL LOW (ref 4.22–5.81)
RDW: 14.4 % (ref 11.5–15.5)
WBC: 8.9 10*3/uL (ref 4.0–10.5)
nRBC: 0 % (ref 0.0–0.2)

## 2018-01-05 LAB — RAPID URINE DRUG SCREEN, HOSP PERFORMED
AMPHETAMINES: NOT DETECTED
AMPHETAMINES: NOT DETECTED
BENZODIAZEPINES: NOT DETECTED
Barbiturates: NOT DETECTED
Barbiturates: NOT DETECTED
Benzodiazepines: NOT DETECTED
Cocaine: NOT DETECTED
Cocaine: NOT DETECTED
OPIATES: POSITIVE — AB
Opiates: POSITIVE — AB
TETRAHYDROCANNABINOL: NOT DETECTED
Tetrahydrocannabinol: NOT DETECTED

## 2018-01-05 LAB — ACETAMINOPHEN LEVEL

## 2018-01-05 LAB — ETHANOL: Alcohol, Ethyl (B): <10 mg/dL (ref <10)

## 2018-01-05 LAB — BRAIN NATRIURETIC PEPTIDE: B NATRIURETIC PEPTIDE 5: 20.7 pg/mL (ref 0.0–100.0)

## 2018-01-05 LAB — SALICYLATE LEVEL: Salicylate Lvl: <7 mg/dL (ref 2.8–30.0)

## 2018-01-05 MED ORDER — ALBUTEROL SULFATE HFA 108 (90 BASE) MCG/ACT IN AERS: 1.0000 | INHALATION_SPRAY | Freq: Four times a day (QID) | RESPIRATORY_TRACT | 0 refills | Status: AC | PRN

## 2018-01-05 MED ORDER — HYDROMORPHONE HCL 1 MG/ML IJ SOLN
1.0000 mg | Freq: Once | INTRAMUSCULAR | Status: AC
  Administered 2018-01-05: 1 mg via INTRAVENOUS
  Filled 2018-01-05: qty 1

## 2018-01-05 MED ORDER — ONDANSETRON HCL 4 MG/2ML IJ SOLN
4.0000 mg | Freq: Once | INTRAMUSCULAR | Status: AC
  Administered 2018-01-05: 4 mg via INTRAVENOUS
  Filled 2018-01-05: qty 2

## 2018-01-05 MED ORDER — IPRATROPIUM-ALBUTEROL 0.5-2.5 (3) MG/3ML IN SOLN
3.0000 mL | Freq: Once | RESPIRATORY_TRACT | Status: AC
  Administered 2018-01-05: 3 mL via RESPIRATORY_TRACT
  Filled 2018-01-05: qty 3

## 2018-01-05 NOTE — Progress Notes (Signed)
Patient is seen by me today via tele-psych and I have consulted with Dr. Jola Babinski.  Patient had presented to the ED yesterday with chest pain and then once he was medically clear from that he reported that he had overdosed on Tylenol.  Patient's Tylenol level was 0 when patient was asked about this he reported that he had vomited up the Tylenol and that has wants not in his system.  Patient then reports that he was at [employer] in [place].  [facility] is in care everywhere and patient does not exist in the system for [facility].  Patient was told this while on tele-psych and then patient started changing hospitals.  Patient then stated he had been on multiple psychotropic medications.  Asked patient where he had his medications filled out and patient informed me that he get all of his medications filled at [facility] in [place].  I contacted [facility] and they reported that the patient did have Lexapro 20 mg and Abilify 10 mg and 5 mg sent to their pharmacy on November in 2018 however those prescriptions were put on hold and were never picked up.  They were also reported that the last medication [facility] by their [facility] was in August 2018 and it was for Bactrim and for Percocet.  Patient reported that he had traveled from [place] by bus and reports that he has been staying here with a friend.  However patient would not give me specific information on the friend or where exactly he was staying at.  After discussing this with Dr. Jola Babinski suspect that patient malingering and is using psychiatric and medical issues to remain in the hospital as he is homeless.  I informed the patient that he would be discharged and we will provide him with follow-up services to [facility] and information for [facility].  I have contacted Dr. Revonda Humphrey and notified her of the recommendations.

## 2018-01-05 NOTE — ED Notes (Signed)
Pt ate lunch. Telepsych being performed.

## 2018-01-05 NOTE — ED Provider Notes (Signed)
Patient was evaluated by behavioral health with extensive background searching, calling the [facility] in [place] and looking at various hospitals for medical records and patient is not telling the truth.  He had not filled the medications he said that he had filled and after looking at multiple hospitals that he gave multiple different names of there is no record of him being there.  Patient will not give any information on the friend he is supposed to be here with but at this time psychiatry felt that he was medically clear and did not meet criteria for admission.  Social work was contacted and he was given shelter information as well as a bus pass to the [employer] [employer] where he could receive treatment and housing.  Patient is satisfied with this plan he endorses no suicidality at this point and he was discharged   Gwyneth Sprout, MD 01/05/18 1451

## 2018-01-05 NOTE — ED Provider Notes (Signed)
I assumed care of this patient from Dr. Alesia Banda at 0000.  Please see their note for further details of Hx, PE.  Briefly patient is a 52 y.o. male who presented with who presents with exertional substernal chest pain.  EKG was nonischemic.  Initial troponin was negative.  Plan was to delta Trop, if negative he will be appropriate for discharge.  Patient was made aware of the plan by Dr. Charm Barges.  At that time he mentioned that he had taken large amount of Tylenol in a suicide attempt.  Tylenol level more than 4 hours after reported ingestion was negative.  Rest of the work-up was unremarkable..   Current plan is to follow-up on the delta Trope.  If negative, have patient evaluated by behavioral health.  Delta troponin negative.  Behavioral health consulted.     Nira Conn, MD 01/05/18 0200

## 2018-01-05 NOTE — ED Notes (Addendum)
ALL belongings - 1 labeled belongings bag and 1 valuables envelope - returned to pt - Pt signed verifying all items present. D/C paperwork given to pt - voiced understanding to follow up w/[facility] - information given - Homeless info given as requested. Bus passes x 2 and [facility] bus pass given.

## 2018-01-05 NOTE — ED Notes (Signed)
Spoke with Verlon Au at poison control, pt is cleared by poison control at this time and pt's case is closed.

## 2018-01-05 NOTE — ED Notes (Signed)
Pt's belongings inventoried, wanded by security and dressed in purple scrubs

## 2018-01-05 NOTE — Discharge Instructions (Signed)
Use inhaler as needed as prescribed.  Follow up with primary care provider, referral given.

## 2018-01-05 NOTE — ED Notes (Addendum)
Pt aware of plan to d/c. Asking for Homeless Shelter info - given. RN received [facility] bus pass from SW so pt may get to [facility] - number and info given.

## 2018-01-05 NOTE — ED Notes (Signed)
Pt ambulated to bathroom and back to room w/o difficulty. Pt asking if [facility] bus pass will get him to [place].

## 2018-01-05 NOTE — BH Assessment (Signed)
Tele Assessment Note   Patient Name: Eric Romero MRN: [id number] Referring Physician: Dr. Leonette Monarch Location of Patient: [facility] Location of Provider: [place]  Eric Romero is an 52 y.o. male.  -Clinician reviewed note by Dr. Leonette Monarch.  Patient had told nursing staff that he had taken 12 Tylenol PM in an effort to kill himself.  Patient has been cleared by poison control.  Pt's acetaminophen level after more than 4 hours post ingestion was negative.    Patient says that he took the Tylenol in an effort to kill himself.  He says he hears voices that tell him to do this.  This has been for the last 4 months, which is when his wife died.  He says he has not been taking medicaitons since then either.  The voices tell him to not take his medications.  Pt has had one previous suicide attempt.  Patient denies any HI and no visual hallucinations.  Patient has been having command auditory hallucinations.  Pt says he has been using '10mg'$  Loratab off the street. Also using methamphetamine and ETOH.  Patient says that he has been staying with friends in [place] for the last few weeks.  He says however that they are not a good influence and he does not feel he can return to that residence.  Patient says he is from [place] originally.  Pt has a 69 year old daughter who is staying with grandparents.  Patient receives outpatient psychiatric services from [facility] [place].  He has been inpatient before about two years ago in [place].  -Clinician discussed patient care with Patriciaann Clan, PA who recommends inpatient psychiatric care.  Clinician informed Jorene Guest, RN that patient met inpatient care criteria.  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 MI (myocardial infarction) ([facility]) 2017  . PE (pulmonary  thromboembolism) ([facility]) 2017      Family History: No family history on file.  Social History:  reports that he has never smoked. He has never used smokeless tobacco. He reports that he drank alcohol. He reports that he does not use drugs.  Additional Social History:  Alcohol / Drug Use Pain Medications: Loratab (off the street) Prescriptions: Was taking Zoloft, Zyprexa, Trazadone.  Has been off them for 2 months. Over the Counter: Tylenol History of alcohol / drug use?: Yes Substance #1 Name of Substance 1: Loratab '10mg'$  1 - Age of First Use: Last 10 years 1 - Amount (size/oz): Loratab '10mg'$  1 - Frequency: 2-3 times in a week 1 - Duration: on-going lately 1 - Last Use / Amount: 10/18 Substance #2 Name of Substance 2: Methamphetamine (smoking) 2 - Age of First Use: Last 10 years 2 - Amount (size/oz): Quarter ounce every three days 2 - Frequency: Every three days 2 - Duration: off and on 2 - Last Use / Amount: 10/15 Substance #3 Name of Substance 3: ETOH 3 - Age of First Use: 52 years of age 75 - Amount (size/oz): 1/2 of a fifth of ETOH 3 - Frequency: Once a week a this rate 3 - Duration: on-going 3 - Last Use / Amount: Couple of weeks ago.  CIWA: CIWA-Ar BP: (!) 162/90 Pulse Rate: 96 COWS:    Allergies:  Allergies  Allergen Reactions  . Coumadin [Warfarin Sodium] Rash    Home Medications:  (Not in a hospital  admission)  OB/GYN Status:  No LMP for male patient.  General Assessment Data Location of Assessment: [facility] ED TTS Assessment: In system Is this a Tele or Face-to-Face Assessment?: Tele Assessment Is this an Initial Assessment or a Re-assessment for this encounter?: Initial Assessment Patient Accompanied by:: N/A Language Other than English: No Living Arrangements: Other (Comment)(Staying with friends in [place]) What gender do you identify as?: Male Marital status: Widowed(Wife died 4 months ago.) Pregnancy Status: No Living Arrangements:  Non-relatives/Friends Can pt return to current living arrangement?: No Admission Status: Voluntary Is patient capable of signing voluntary admission?: Yes Referral Source: Self/Family/Friend Insurance type: MCD/MCR     Crisis Care Plan Living Arrangements: Non-relatives/Friends Name of Psychiatrist: [facility] in [place] Name of Therapist: [facility] in [place]  Education Status Is patient currently in school?: No Is the patient employed, unemployed or receiving disability?: Receiving disability income  Risk to self with the past 6 months Suicidal Ideation: Yes-Currently Present Has patient been a risk to self within the past 6 months prior to admission? : Yes Suicidal Intent: Yes-Currently Present Has patient had any suicidal intent within the past 6 months prior to admission? : Yes Is patient at risk for suicide?: Yes Suicidal Plan?: Yes-Currently Present Has patient had any suicidal plan within the past 6 months prior to admission? : No Specify Current Suicidal Plan: Overdose Access to Means: Yes Specify Access to Suicidal Means: OTC meds What has been your use of drugs/alcohol within the last 12 months?: Opiates, ETOH, amphetamines Previous Attempts/Gestures: Yes How many times?: 1 Other Self Harm Risks: None Triggers for Past Attempts: Other (Comment)(After his wife died.) Intentional Self Injurious Behavior: None Family Suicide History: No Recent stressful life event(s): Loss (Comment)(Wife died 4 months ago.) Persecutory voices/beliefs?: No Depression: Yes Depression Symptoms: Despondent, Loss of interest in usual pleasures, Feeling worthless/self pity, Insomnia, Isolating Substance abuse history and/or treatment for substance abuse?: Yes Suicide prevention information given to non-admitted patients: Not applicable  Risk to Others within the past 6 months Homicidal Ideation: No Does patient have any lifetime risk of violence toward others beyond the six  months prior to admission? : No Thoughts of Harm to Others: No Current Homicidal Intent: No Current Homicidal Plan: No Access to Homicidal Means: No Identified Victim: No one History of harm to others?: No Assessment of Violence: None Noted Violent Behavior Description: Pt denies Does patient have access to weapons?: No Criminal Charges Pending?: No Does patient have a court date: No Is patient on probation?: No  Psychosis Hallucinations: Auditory, With command(Voices tell him to kill self.) Delusions: None noted  Mental Status Report Appearance/Hygiene: Disheveled, Poor hygiene, In scrubs Eye Contact: Good Motor Activity: Freedom of movement, Unremarkable Speech: Logical/coherent Level of Consciousness: Alert Mood: Depressed, Helpless, Sad Affect: Anxious, Sad Anxiety Level: Moderate Thought Processes: Coherent, Relevant Judgement: Impaired Orientation: Person, Place, Situation, Time Obsessive Compulsive Thoughts/Behaviors: None  Cognitive Functioning Concentration: Decreased Memory: Recent Intact, Remote Intact Is patient IDD: No Insight: Fair Impulse Control: Poor Appetite: Fair Have you had any weight changes? : Loss Amount of the weight change? (lbs): 15 lbs Sleep: Decreased Total Hours of Sleep: (<4H/D) Vegetative Symptoms: Staying in bed  ADLScreening [facility] Assessment Services) Patient's cognitive ability adequate to safely complete daily activities?: Yes Patient able to express need for assistance with ADLs?: Yes Independently performs ADLs?: Yes (appropriate for developmental age)  Prior Inpatient Therapy Prior Inpatient Therapy: Yes Prior Therapy Dates: Couple years ago Prior Therapy Facilty/Provider(s): Facility in [place] Reason for Treatment:  Depresion  Prior Outpatient Therapy Prior Outpatient Therapy: Yes Prior Therapy Dates: Past few years Prior Therapy Facilty/Provider(s): [facility] in [place] Reason for Treatment: med  management Does patient have an ACCT team?: No Does patient have Intensive In-House Services?  : No Does patient have [facility] services? : No Does patient have P4CC services?: No  ADL Screening (condition at time of admission) Patient's cognitive ability adequate to safely complete daily activities?: Yes Is the patient deaf or have difficulty hearing?: No Does the patient have difficulty seeing, even when wearing glasses/contacts?: No Does the patient have difficulty concentrating, remembering, or making decisions?: No Patient able to express need for assistance with ADLs?: Yes Does the patient have difficulty dressing or bathing?: No Independently performs ADLs?: Yes (appropriate for developmental age) Does the patient have difficulty walking or climbing stairs?: No Weakness of Legs: None Weakness of Arms/Hands: None       Abuse/Neglect Assessment (Assessment to be complete while patient is alone) Abuse/Neglect Assessment Can Be Completed: Yes Physical Abuse: Denies Verbal Abuse: Denies Sexual Abuse: Denies Exploitation of patient/patient's resources: Denies Self-Neglect: Denies     Regulatory affairs officer (For Healthcare) Does Patient Have a Medical Advance Directive?: No Would patient like information on creating a medical advance directive?: No - Patient declined          Disposition:  Disposition Initial Assessment Completed for this Encounter: Yes Patient referred to: Other (Comment)(Pt to be reviewed by PA)  This service was provided via telemedicine using a 2-way, interactive audio and video technology.  Names of all persons participating in this telemedicine service and their role in this encounter. Name: Eric Romero Role: Patient  Name: Eric Romero, M.S. LCAS QP Role: Clinician  Name:  Role:   Name:  Role:     Eric Romero 01/05/2018 2:53 AM

## 2018-01-05 NOTE — ED Triage Notes (Signed)
Pt arrives with c/o left sided CP that radiates to the left arm and left jaw area after eating dinner. Reports some dizziness, sob and vomiting episodes. No meds PTA.

## 2018-01-05 NOTE — BHH Counselor (Signed)
Pt was reassessed.  He continues to endorse suicidal ideation.  He reported that he intentionally overdosed on Tylenol PM in a self-described suicide attempt.  Pt also reported use of meth and non-compliance with medication (not taking Zoloft, Zyprexa, Trazodone, Xanax.  Pt stated that if he were discharged, he would attempt suicide again.

## 2018-01-05 NOTE — ED Provider Notes (Signed)
[facility]-EMERGENCY DEPT Provider Note   CSN: [id number] Arrival date & time: 01/05/18  1910     History   Chief Complaint Chief Complaint  Patient presents with  . Chest Pain    HPI Eric Romero is a 52 y.o. male.  52 year old male brought in by EMS for chest pain and shortness of breath.  Patient states that his pain started tonight after eating a sandwich and a salad tonight, cried as achy, sharp, dull on the left side of his chest, radiates to his left jaw, left shoulder, left leg.  Nothing makes pain better or worse.  Associated with shortness of breath, nausea, vomiting.  Patient then states that he has had this pain off and on all day today.  Asked about his ER visit yesterday, discharge this afternoon, patient admits that he also had chest pain yesterday.  Patient states he had a heart attack 2 years ago while living in [place], treated with angioplasty, states that he is no longer followed by his cardiologist as this provider retired, unable to recall what hospital he was treated at.  Patient states that he is visiting friends in [place], lives in [place].  Patient later states that he is no longer lives in [place], he is currently living near [facility] and goes to the emergency room for all of his care.  Also reports history of congestive heart failure, states he takes Lasix, states his last dose was yesterday although states he took all of his regular home medications today, including a 81 mg aspirin.  Denies history of smoking, no family cardiac history.  No other complaints or concerns.     Past Medical History:  Diagnosis Date  . MI (myocardial infarction) ([facility]) 2017  . PE (pulmonary thromboembolism) ([facility]) 2017    There are no active problems to display for this patient.   History reviewed. No pertinent surgical history.      Home Medications    Prior to Admission medications   Not on File    Family History No family  history on file.  Social History Social History   Tobacco Use  . Smoking status: Never Smoker  . Smokeless tobacco: Never Used  Substance Use Topics  . Alcohol use: Not Currently    Frequency: Never  . Drug use: Never     Allergies   Coumadin [warfarin sodium]   Review of Systems Review of Systems  Constitutional: Negative for chills, diaphoresis, fatigue and fever.  Respiratory: Positive for shortness of breath and wheezing. Negative for cough and chest tightness.   Cardiovascular: Positive for chest pain. Negative for palpitations and leg swelling.  Gastrointestinal: Positive for nausea and vomiting. Negative for abdominal pain, constipation and diarrhea.  Genitourinary: Negative for difficulty urinating.  Skin: Negative for rash and wound.  Allergic/Immunologic: Negative for immunocompromised state.  Neurological: Negative for weakness and numbness.  Psychiatric/Behavioral: Negative for confusion.  All other systems reviewed and are negative.    Physical Exam Updated Vital Signs BP 116/74 (BP Location: Left Arm)   Pulse 84   Temp (!) 97.4 F (36.3 C) (Oral)   Resp 18   SpO2 94%   Physical Exam  Constitutional: He is oriented to person, place, and time. He appears well-developed and well-nourished. No distress.  HENT:  Head: Normocephalic and atraumatic.  Cardiovascular: Regular rhythm, intact distal pulses and normal pulses. Tachycardia present.  No murmur heard. Pulmonary/Chest: He has wheezes.  Pursed lip breathing, states he is [facility]  Musculoskeletal:  Right lower leg: Normal. He exhibits no tenderness and no edema.       Left lower leg: Normal. He exhibits no tenderness and no edema.  Neurological: He is alert and oriented to person, place, and time.  Skin: Skin is warm and dry. He is not diaphoretic.  Psychiatric: He has a normal mood and affect. His behavior is normal.  Nursing note and vitals reviewed.    ED Treatments / Results  Labs (all  labs ordered are listed, but only abnormal results are displayed) Labs Reviewed  BASIC METABOLIC PANEL - Abnormal; Notable for the following components:      Result Value   Glucose, Bld 120 (*)    Calcium 8.5 (*)    All other components within normal limits  CBC - Abnormal; Notable for the following components:   RBC 4.06 (*)    Hemoglobin 12.4 (*)    All other components within normal limits  ACETAMINOPHEN LEVEL - Abnormal; Notable for the following components:   Acetaminophen (Tylenol), Serum <10 (*)    All other components within normal limits  RAPID URINE DRUG SCREEN, HOSP PERFORMED - Abnormal; Notable for the following components:   Opiates POSITIVE (*)    All other components within normal limits  BRAIN NATRIURETIC PEPTIDE  ETHANOL  SALICYLATE LEVEL  I-STAT TROPONIN, ED    EKG EKG Interpretation  Date/Time:  Sunday January 05 2018 19:14:29 EDT Ventricular Rate:  111 PR Interval:    QRS Duration: 107 QT Interval:  337 QTC Calculation: 458 R Axis:   175 Text Interpretation:  Sinus tachycardia Consider right ventricular hypertrophy Baseline wander in lead(s) II III aVF SINCE LAST TRACING HEART RATE HAS INCREASED Confirmed by Margarita Grizzle [phone]) on 01/05/2018 9:06:27 PM   Radiology Dg Chest 2 View  Result Date: 01/05/2018 CLINICAL DATA:  Left side chest pain EXAM: CHEST - 2 VIEW COMPARISON:  01/04/2018 FINDINGS: Heart and mediastinal contours are within normal limits. No focal opacities or effusions. No acute bony abnormality. Mild peribronchial thickening. IMPRESSION: Mild bronchitic changes. Electronically Signed   By: Charlett Nose M.D.   On: 01/05/2018 19:46   Dg Chest 2 View  Result Date: 01/04/2018 CLINICAL DATA:  Left-sided chest pain. EXAM: CHEST - 2 VIEW COMPARISON:  None. FINDINGS: The cardiomediastinal contours are normal. The lungs are clear. Pulmonary vasculature is normal. No consolidation, pleural effusion, or pneumothorax. No acute osseous abnormalities  are seen. Degenerative change in the spine. IMPRESSION: No acute chest finding. Electronically Signed   By: Narda Rutherford M.D.   On: 01/04/2018 22:06    Procedures Procedures (including critical care time)  Medications Ordered in ED Medications  HYDROmorphone (DILAUDID) injection 1 mg (1 mg Intravenous Given 01/05/18 2055)  ondansetron (ZOFRAN) injection 4 mg (4 mg Intravenous Given 01/05/18 2055)  ipratropium-albuterol (DUONEB) 0.5-2.5 (3) MG/3ML nebulizer solution 3 mL (3 mLs Nebulization Given 01/05/18 2234)     Initial Impression / Assessment and Plan / ED Course  I have reviewed the triage vital signs and the nursing notes.  Pertinent labs & imaging results that were available during my care of the patient were reviewed by me and considered in my medical decision making (see chart for details).  Clinical Course as of Jan 06 2240  Wynelle Link Jan 05, 2018  2218 Vitals currently 116/74 Bp, resp 16 and even, unlabored (although patient returns to pursed lip breathing after repeat lung exam), HR 70s RRR, O2 94%. Repeat lung exam with mild wheezing, duo neb tx ordered. Review  of care everywhere records- unable to locate any records at [facility] where patient states he gets his regular treatment through the Er.  Initial troponin is negative, BMP unremarkable, CBC with mild anemia, BNP normal. CXR shows bronchitic changes. Given Zofran and Dilaudid for his pain, reports ongoing pain and would like more pain medicine.  Plan is to repeat troponin, if negative, patient may be dc home to follow up with PCP/cardiology. Suspect malingering behavior.    [LM]  2220 51yo male presents with complaint of left side chest pain which radiates to his left jaw, left shoulder and left leg. Hx of MI in 2016, treated with angioplasty at unknown hospital in [place]. Patient was seen at [facility] ER yesterday with same symptoms, after normal work up including troponin x 2 patient was advised he would be dc  home. Patient then reported tylenol overdose and had BH evaluation. Tylenol level was normal. Patient was dc this afternoon from the hospital. Patient states he has had chest pain off and on all day, called 911 after eating a salad and sandwich and developing pain.    [LM]  2241 Care signed out to Sharilyn Sites, PA-C, pending repeat troponin.    [LM]    Clinical Course User Index [LM] Jeannie Fend, PA-C   Final Clinical Impressions(s) / ED Diagnoses   Final diagnoses:  Bronchitis    ED Discharge Orders    None       Jeannie Fend, PA-C 01/05/18 2241    Margarita Grizzle, MD 01/06/18 1728

## 2018-01-05 NOTE — ED Notes (Signed)
Arrived to pod at this time.  Doing TTS assessment.

## 2018-01-05 NOTE — ED Provider Notes (Signed)
Assumed care from PA [facility] at shift change.  See prior note for full H&P.  Briefly, 52 year old male reports chest pain after eating a sandwich and a salad tonight.  Patient seen in the ER yesterday at [facility] as well with negative work-up.  At time of discharge he began complaining of suicidal ideation.  Work-up today has been reassuring thus far.  Did have some wheezing on exam to treated with DuoNeb with good improvement.  Plan: Delta troponin.  If negative can discharge home with outpatient follow-up.  Results for orders placed or performed during the hospital encounter of 01/05/18  Basic metabolic panel  Result Value Ref Range   Sodium 140 135 - 145 mmol/L   Potassium 3.8 3.5 - 5.1 mmol/L   Chloride 106 98 - 111 mmol/L   CO2 25 22 - 32 mmol/L   Glucose, Bld 120 (H) 70 - 99 mg/dL   BUN 14 6 - 20 mg/dL   Creatinine, Ser 9.19 0.61 - 1.24 mg/dL   Calcium 8.5 (L) 8.9 - 10.3 mg/dL   GFR calc non Af Amer >60 >60 mL/min   GFR calc Af Amer >60 >60 mL/min   Anion gap 9 5 - 15  CBC  Result Value Ref Range   WBC 8.9 4.0 - 10.5 K/uL   RBC 4.06 (L) 4.22 - 5.81 MIL/uL   Hemoglobin 12.4 (L) 13.0 - 17.0 g/dL   HCT 16.6 06.0 - 04.5 %   MCV 96.1 80.0 - 100.0 fL   MCH 30.5 26.0 - 34.0 pg   MCHC 31.8 30.0 - 36.0 g/dL   RDW 99.7 74.1 - 42.3 %   Platelets 246 150 - 400 K/uL   nRBC 0.0 0.0 - 0.2 %  Brain natriuretic peptide  Result Value Ref Range   B Natriuretic Peptide 20.7 0.0 - 100.0 pg/mL  Ethanol  Result Value Ref Range   Alcohol, Ethyl (B) <10 <10 mg/dL  Salicylate level  Result Value Ref Range   Salicylate Lvl <7.0 2.8 - 30.0 mg/dL  Acetaminophen level  Result Value Ref Range   Acetaminophen (Tylenol), Serum <10 (L) 10 - 30 ug/mL  Urine rapid drug screen (hosp performed)  Result Value Ref Range   Opiates POSITIVE (A) NONE DETECTED   Cocaine NONE DETECTED NONE DETECTED   Benzodiazepines NONE DETECTED NONE DETECTED   Amphetamines NONE DETECTED NONE DETECTED   Tetrahydrocannabinol NONE DETECTED NONE DETECTED   Barbiturates NONE DETECTED NONE DETECTED  I-stat troponin, ED  Result Value Ref Range   Troponin i, poc 0.00 0.00 - 0.08 ng/mL   Comment 3          I-stat troponin, ED  Result Value Ref Range   Troponin i, poc 0.00 0.00 - 0.08 ng/mL   Comment 3           Dg Chest 2 View  Result Date: 01/05/2018 CLINICAL DATA:  Left side chest pain EXAM: CHEST - 2 VIEW COMPARISON:  01/04/2018 FINDINGS: Heart and mediastinal contours are within normal limits. No focal opacities or effusions. No acute bony abnormality. Mild peribronchial thickening. IMPRESSION: Mild bronchitic changes. Electronically Signed   By: Charlett Nose M.D.   On: 01/05/2018 19:46   Dg Chest 2 View  Result Date: 01/04/2018 CLINICAL DATA:  Left-sided chest pain. EXAM: CHEST - 2 VIEW COMPARISON:  None. FINDINGS: The cardiomediastinal contours are normal. The lungs are clear. Pulmonary vasculature is normal. No consolidation, pleural effusion, or pneumothorax. No acute osseous abnormalities are seen. Degenerative change in  the spine. IMPRESSION: No acute chest finding. Electronically Signed   By: Narda Rutherford M.D.   On: 01/04/2018 22:06    Delta troponin is negative.  Given negative evaluation 2 days in a row, do not suspect ACS, PE, dissection, acute cardiac event.  Feel he stable for discharge home.  Recommended to establish care with PCP.  He was given prescription for inhaler.  He will return here for any new/acute changes.   Garlon Hatchet, PA-C 01/06/18 0017    Derwood Kaplan, MD 01/06/18 [phone]

## 2018-01-05 NOTE — Progress Notes (Signed)
CSW received call from MD informing CSW that pt was being cleared by psych and being recommended for discharge to the [facility] and to [place]. CSW has provided needed documents to RN on unit for pt at this time. From CSW's past knowledge pt's have had trouble getting to [facility] however CSW still advised to give pt's bus passes.   At this time there are no further CSW needs at this time.    Eric Romero, MSW, LCSW-A Emergency Department Clinical Social Worker [phone]

## 2018-01-05 NOTE — ED Notes (Signed)
Pt noted w/urinal in room. Pt ambulatory. Voiced understanding no urinals to be used and urinal removed from room. Pt then asking for door to be closed "they did it last night". Advised pt door may be cracked open but that Sitter must be able to keep eyes on him.

## 2018-01-05 NOTE — ED Notes (Signed)
Sitter at bedside.

## 2019-11-07 IMAGING — CR DG CHEST 2V
2 series · 2 of 2 positions shown · non-contrast
Comparison: 01/04/2018

CLINICAL DATA: Left side chest pain

EXAM:
CHEST - 2 VIEW

[w chest pa]
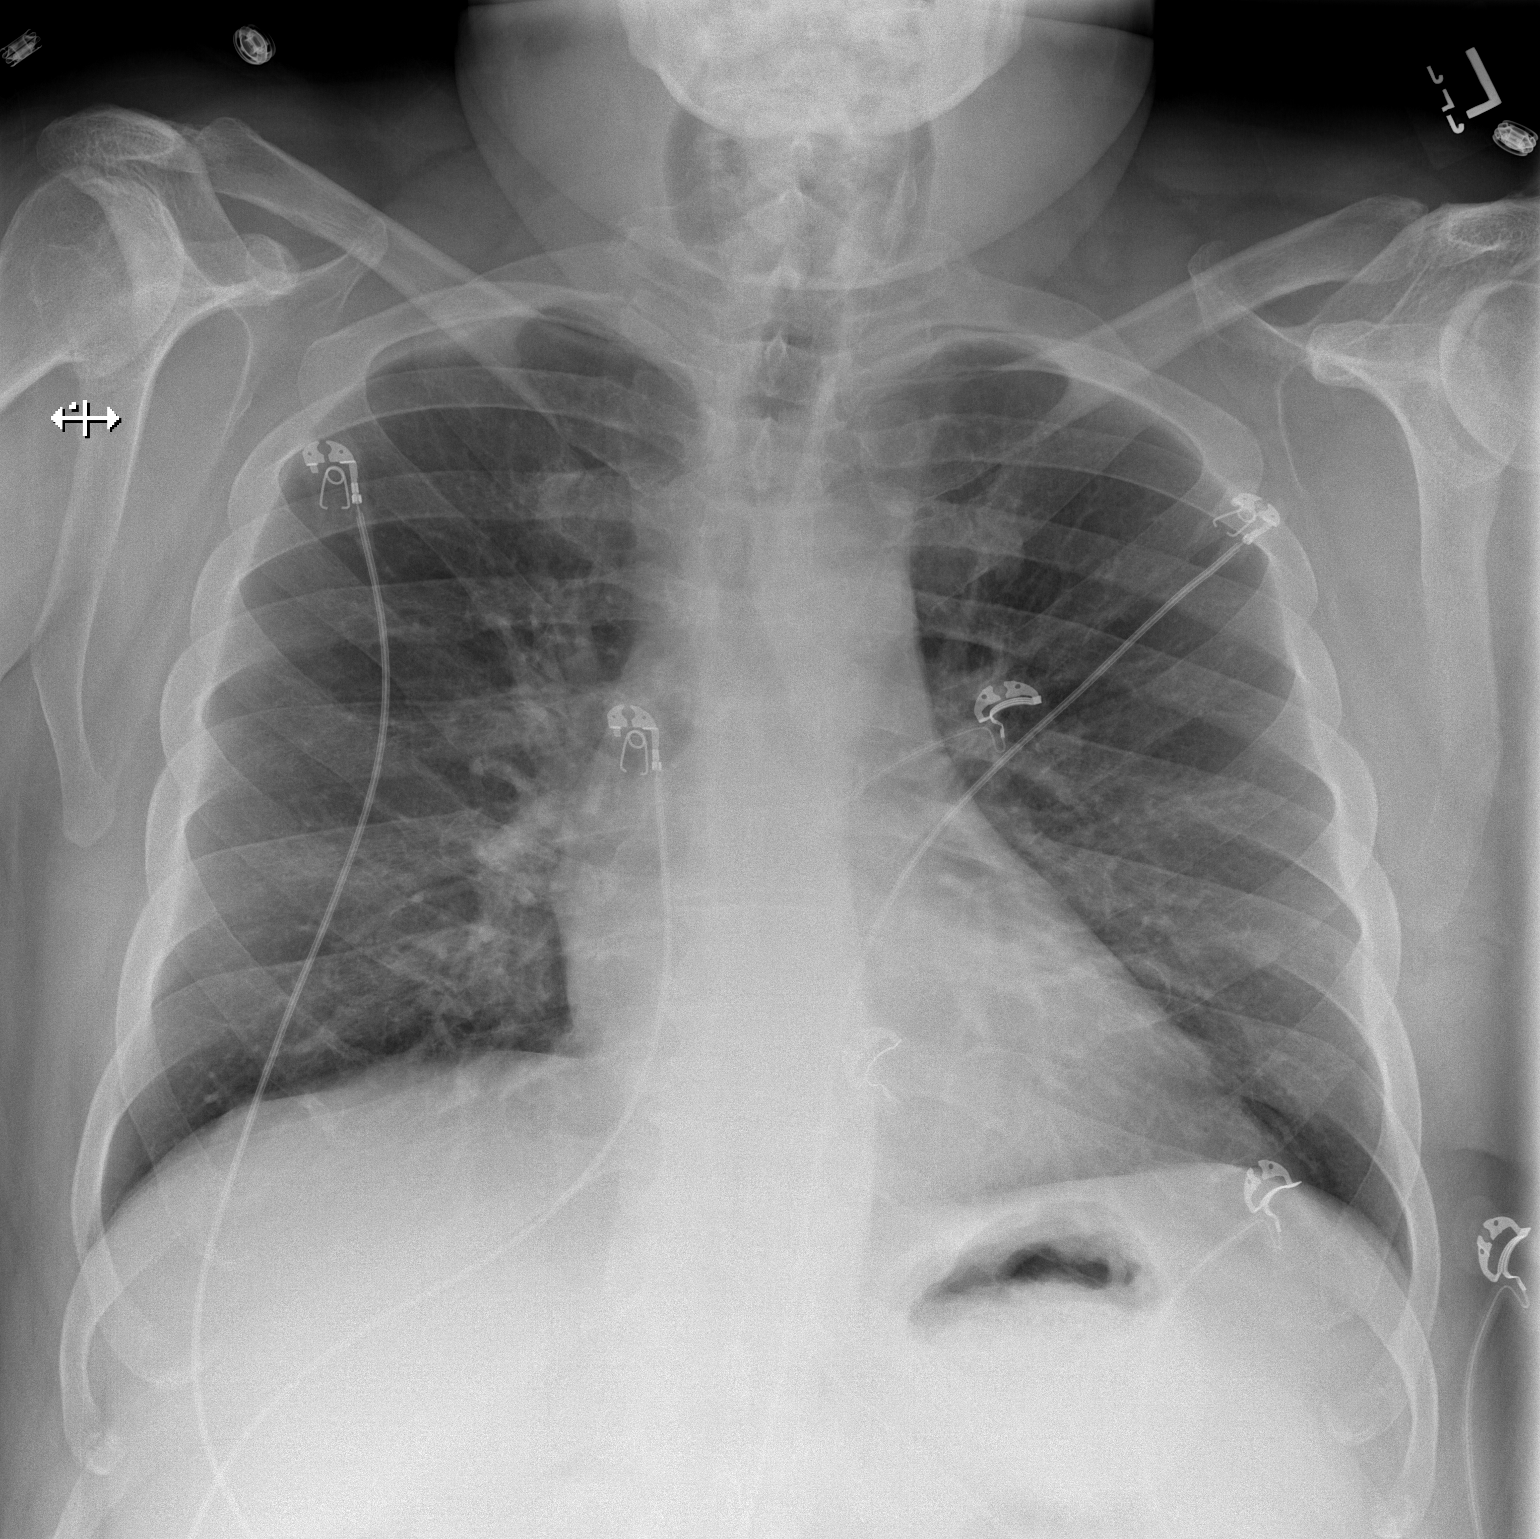

[w chest lat]
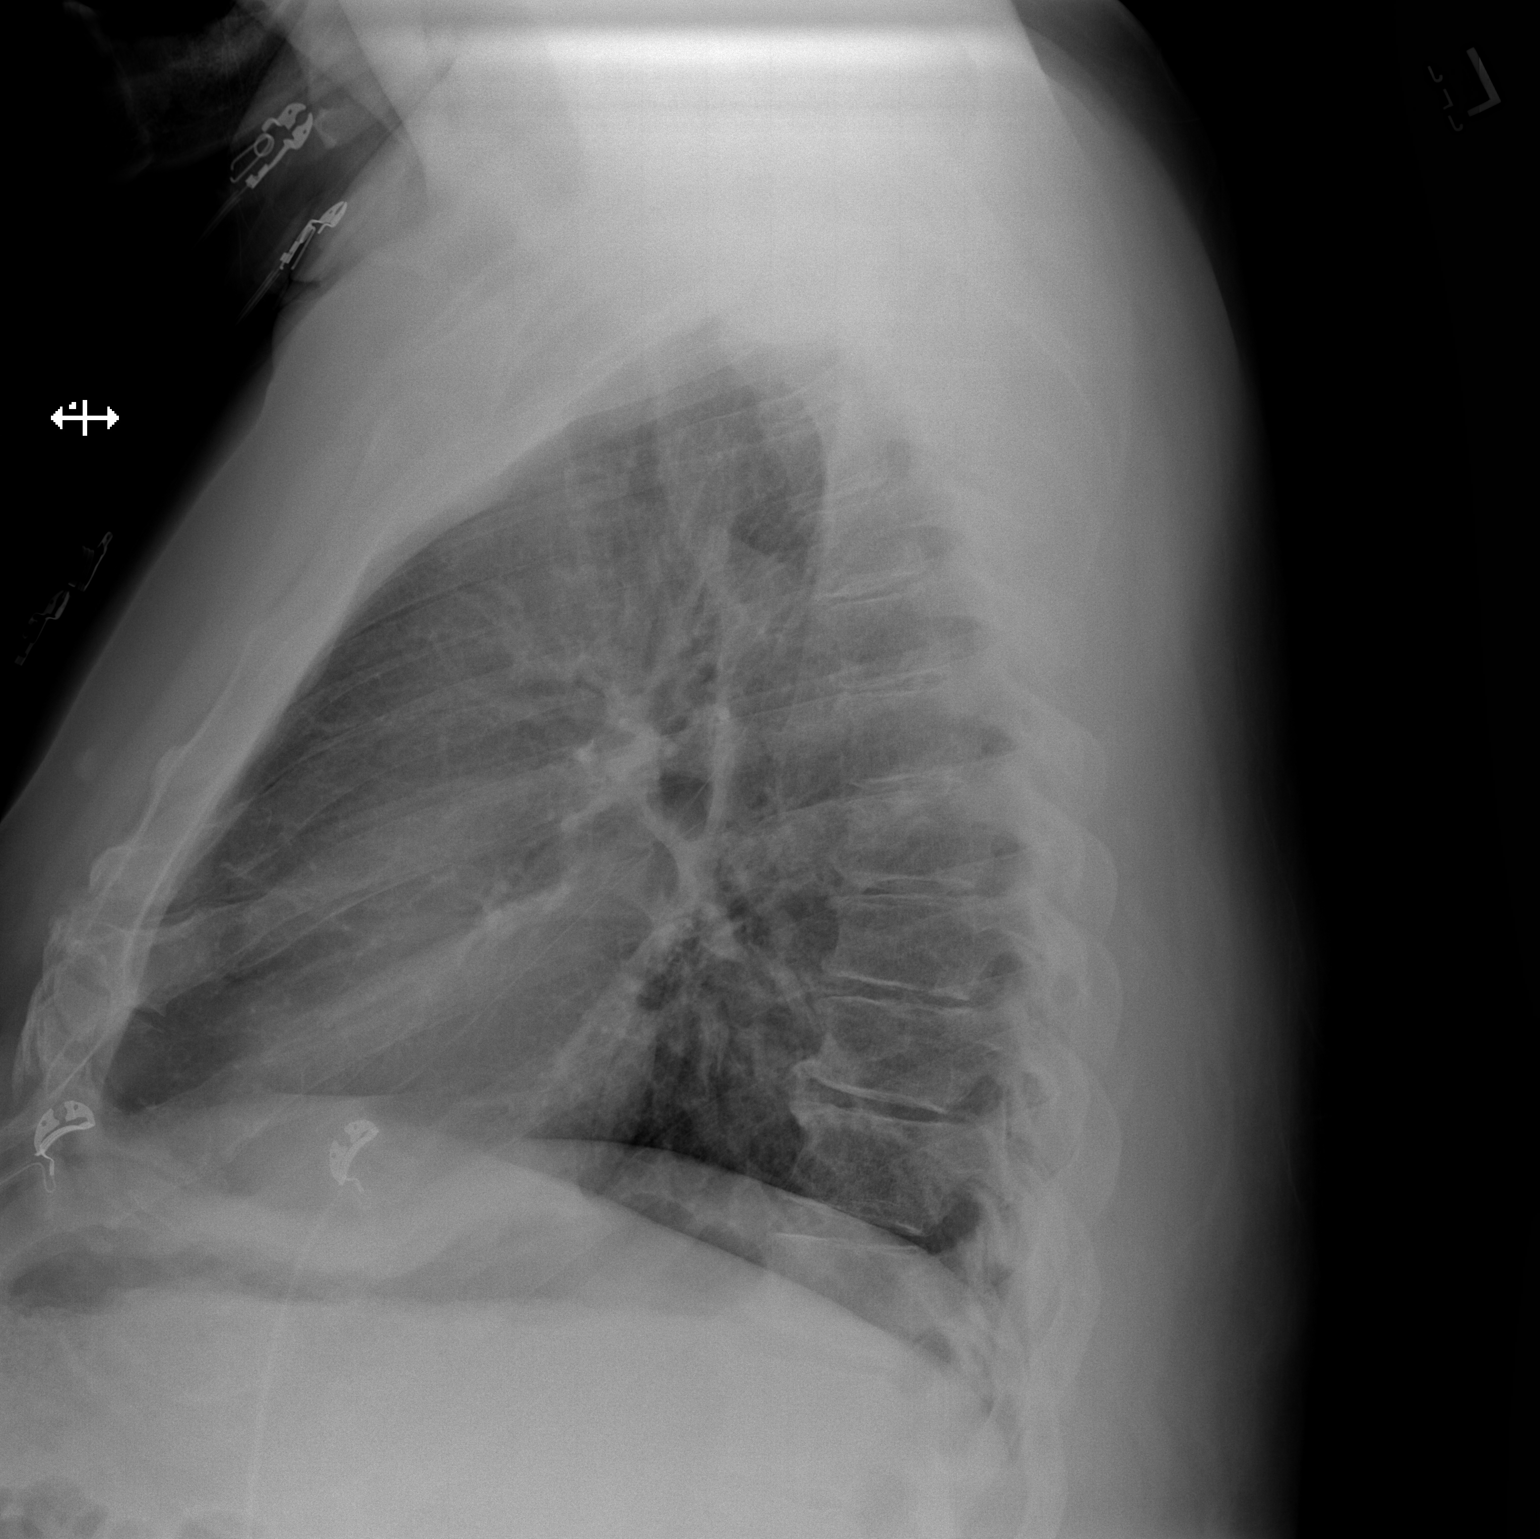

[2 of 2 positions shown; findings below may reference images not displayed]

FINDINGS: Heart and mediastinal contours are within normal limits. No focal
opacities or effusions. No acute bony abnormality. Mild
peribronchial thickening.
IMPRESSION: Mild bronchitic changes.
